# Patient Record
Sex: Male | Born: 1955 | Race: Black or African American | Hispanic: No | Marital: Married | Smoking: Current every day smoker
Health system: Southern US, Community
[De-identification: ages and names within clinical notes are randomized; demographics above are authoritative.]

## PROBLEM LIST (undated history)

## (undated) DIAGNOSIS — Z720 Tobacco use: Secondary | ICD-10-CM

## (undated) DIAGNOSIS — E785 Hyperlipidemia, unspecified: Secondary | ICD-10-CM

## (undated) DIAGNOSIS — M549 Dorsalgia, unspecified: Secondary | ICD-10-CM

## (undated) DIAGNOSIS — Q2381 Bicuspid aortic valve: Secondary | ICD-10-CM

## (undated) DIAGNOSIS — I10 Essential (primary) hypertension: Secondary | ICD-10-CM

## (undated) DIAGNOSIS — I351 Nonrheumatic aortic (valve) insufficiency: Secondary | ICD-10-CM

## (undated) DIAGNOSIS — Q231 Congenital insufficiency of aortic valve: Secondary | ICD-10-CM

## (undated) HISTORY — DX: Bicuspid aortic valve: Q23.81

## (undated) HISTORY — DX: Nonrheumatic aortic (valve) insufficiency: I35.1

## (undated) HISTORY — DX: Dorsalgia, unspecified: M54.9

## (undated) HISTORY — PX: EXTERNAL EAR SURGERY: SHX627

## (undated) HISTORY — DX: Hyperlipidemia, unspecified: E78.5

## (undated) HISTORY — DX: Congenital insufficiency of aortic valve: Q23.1

## (undated) HISTORY — DX: Tobacco use: Z72.0

## (undated) HISTORY — DX: Essential (primary) hypertension: I10

## (undated) HISTORY — PX: AORTIC VALVE REPLACEMENT: SHX41

---

## 2007-12-01 ENCOUNTER — Other Ambulatory Visit: Payer: Self-pay

## 2007-12-01 ENCOUNTER — Inpatient Hospital Stay: Payer: Self-pay | Admitting: Internal Medicine

## 2008-09-28 ENCOUNTER — Ambulatory Visit: Payer: Self-pay | Admitting: Oncology

## 2008-10-17 ENCOUNTER — Ambulatory Visit: Payer: Self-pay | Admitting: Oncology

## 2008-10-28 ENCOUNTER — Ambulatory Visit: Payer: Self-pay | Admitting: Oncology

## 2008-11-28 ENCOUNTER — Ambulatory Visit: Payer: Self-pay | Admitting: Oncology

## 2012-03-12 ENCOUNTER — Ambulatory Visit: Payer: Self-pay | Admitting: Family Medicine

## 2012-12-20 IMAGING — CR DG HIP COMPLETE 2+V*L*
1 series · 3 of 3 positions shown · non-contrast
Comparison: none

REASON FOR EXAM: pain in limb, left hip pain
COMMENTS:

PROCEDURE:     KDR - KDXR HIP LEFT COMPLETE  - March 12, 2012 [DATE]
RESULT:     Comparison:  None

[Series 1: ap · 0.17mm/px · 3 of 3 slices shown]
[im 1/3]
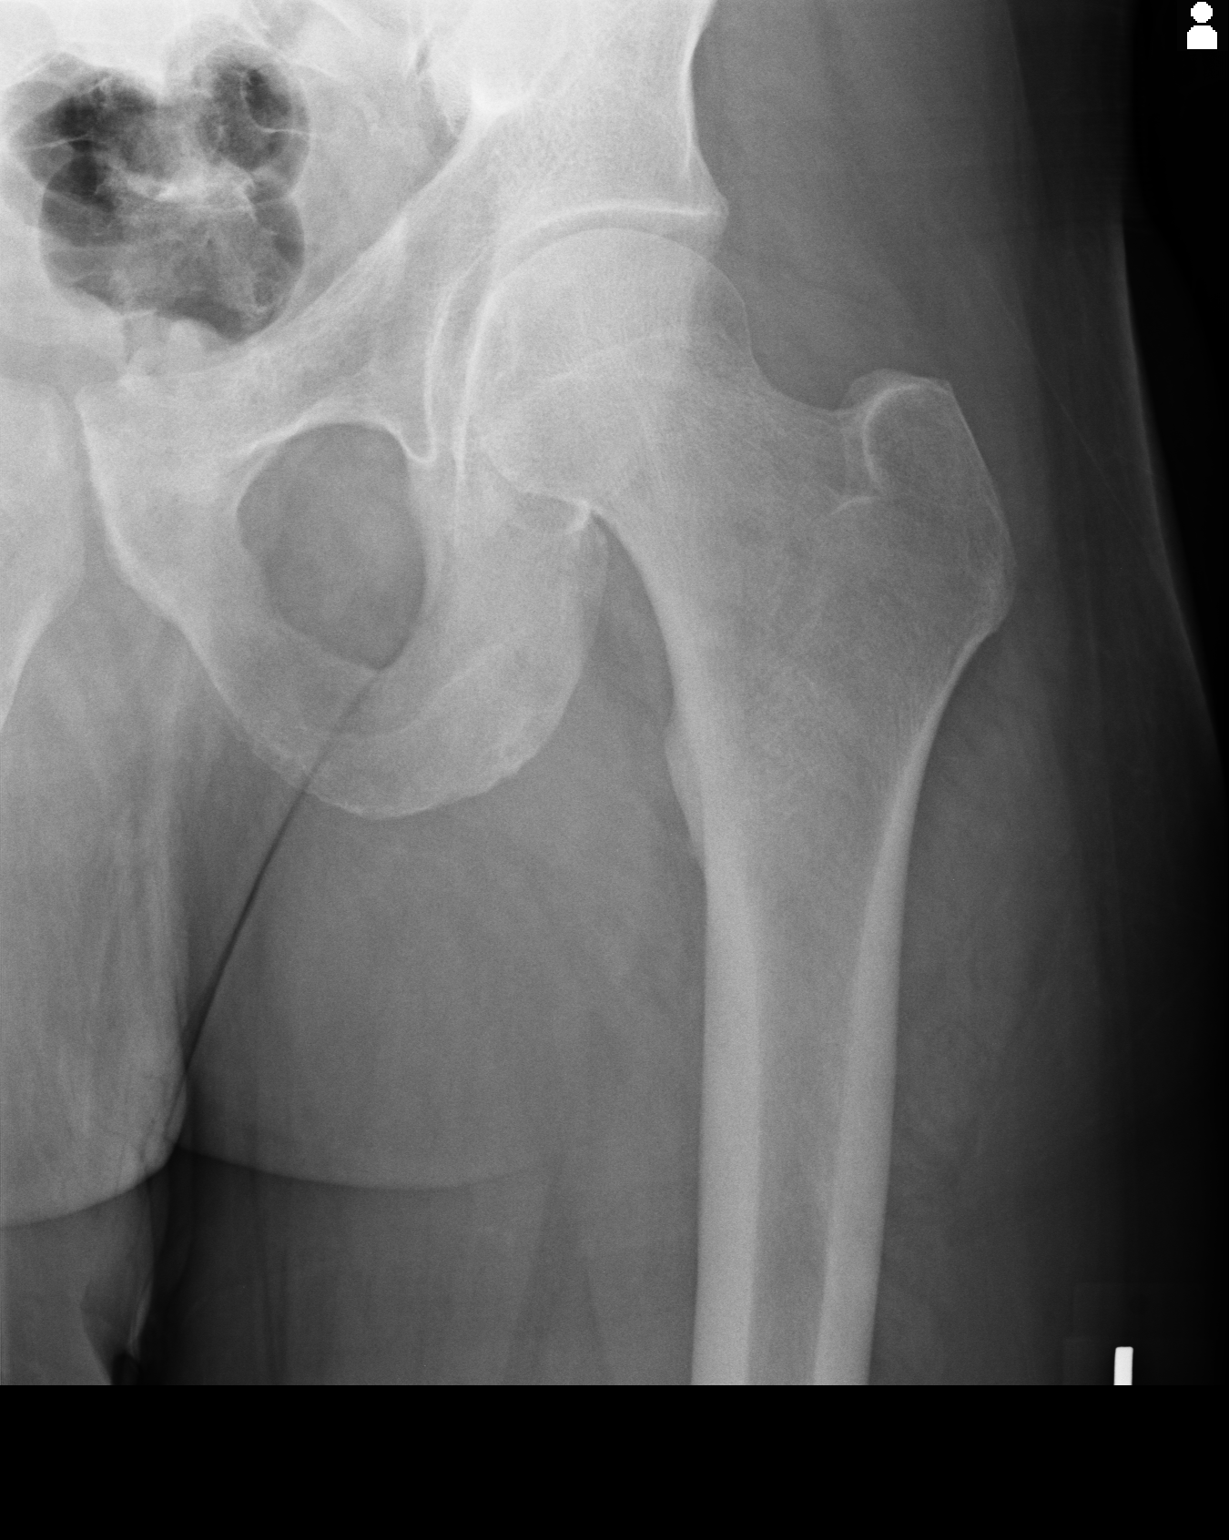
[im 2/3]
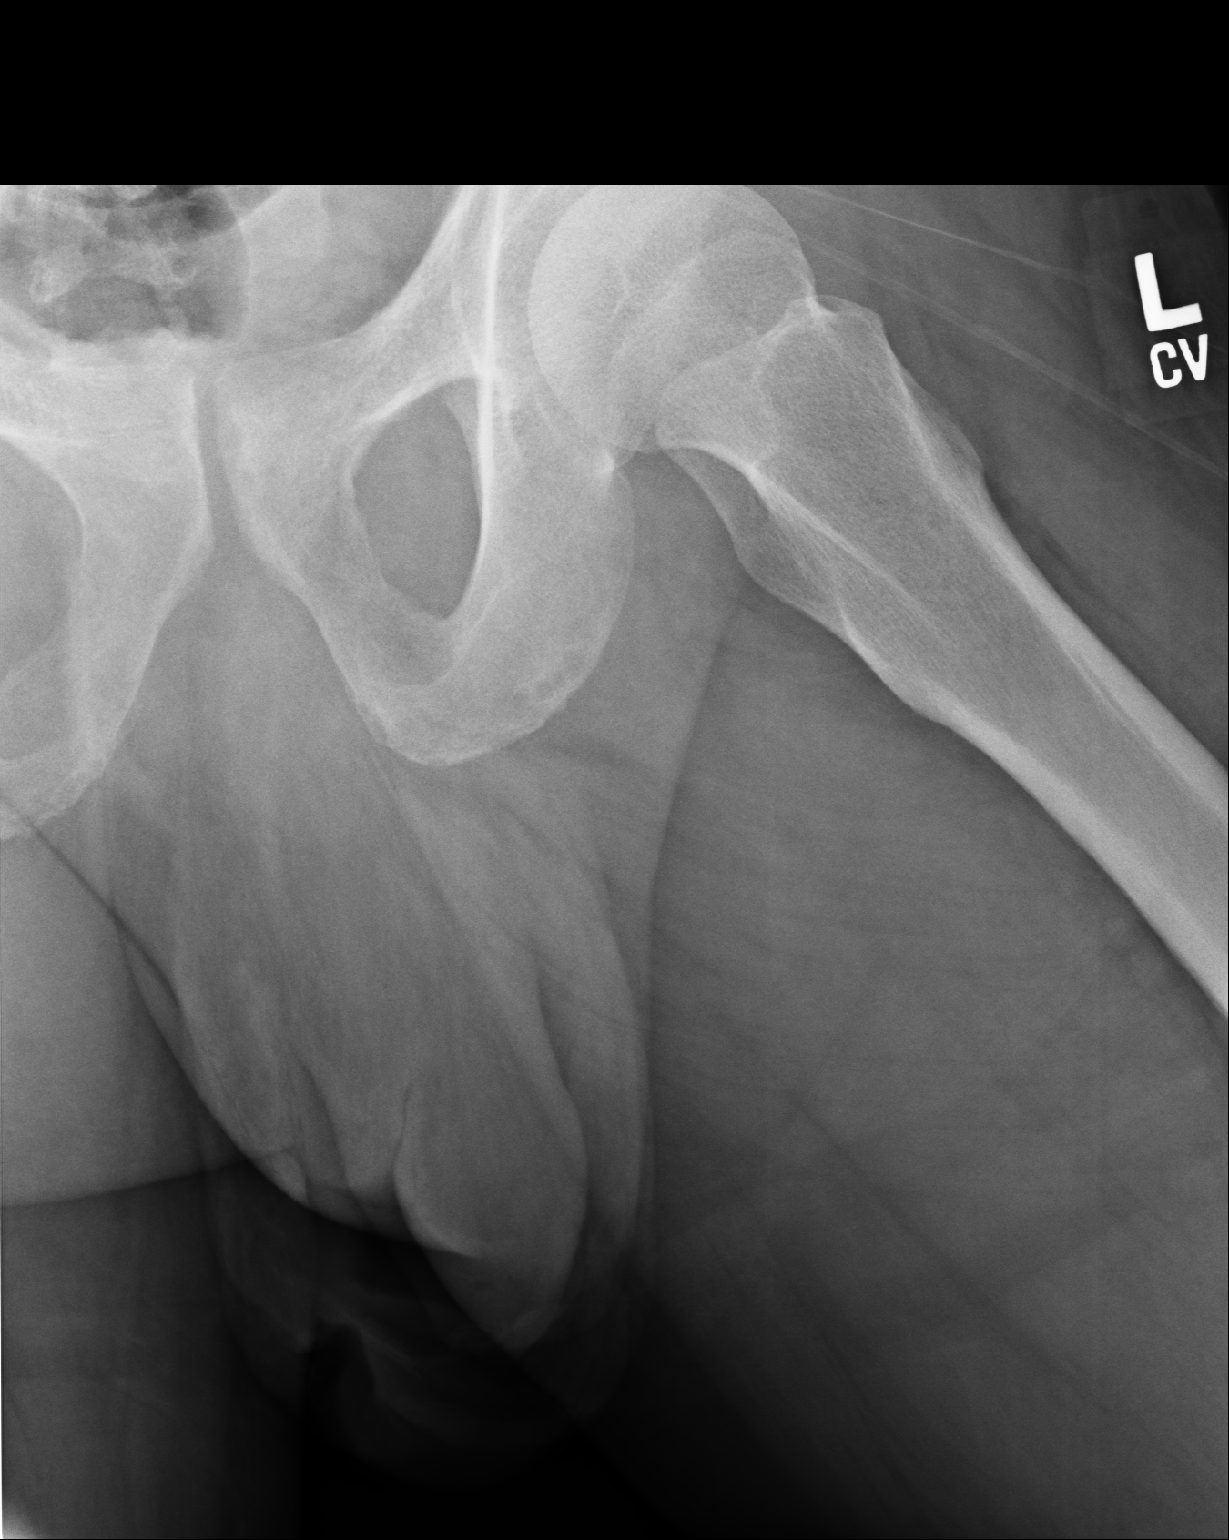
[im 3/3]
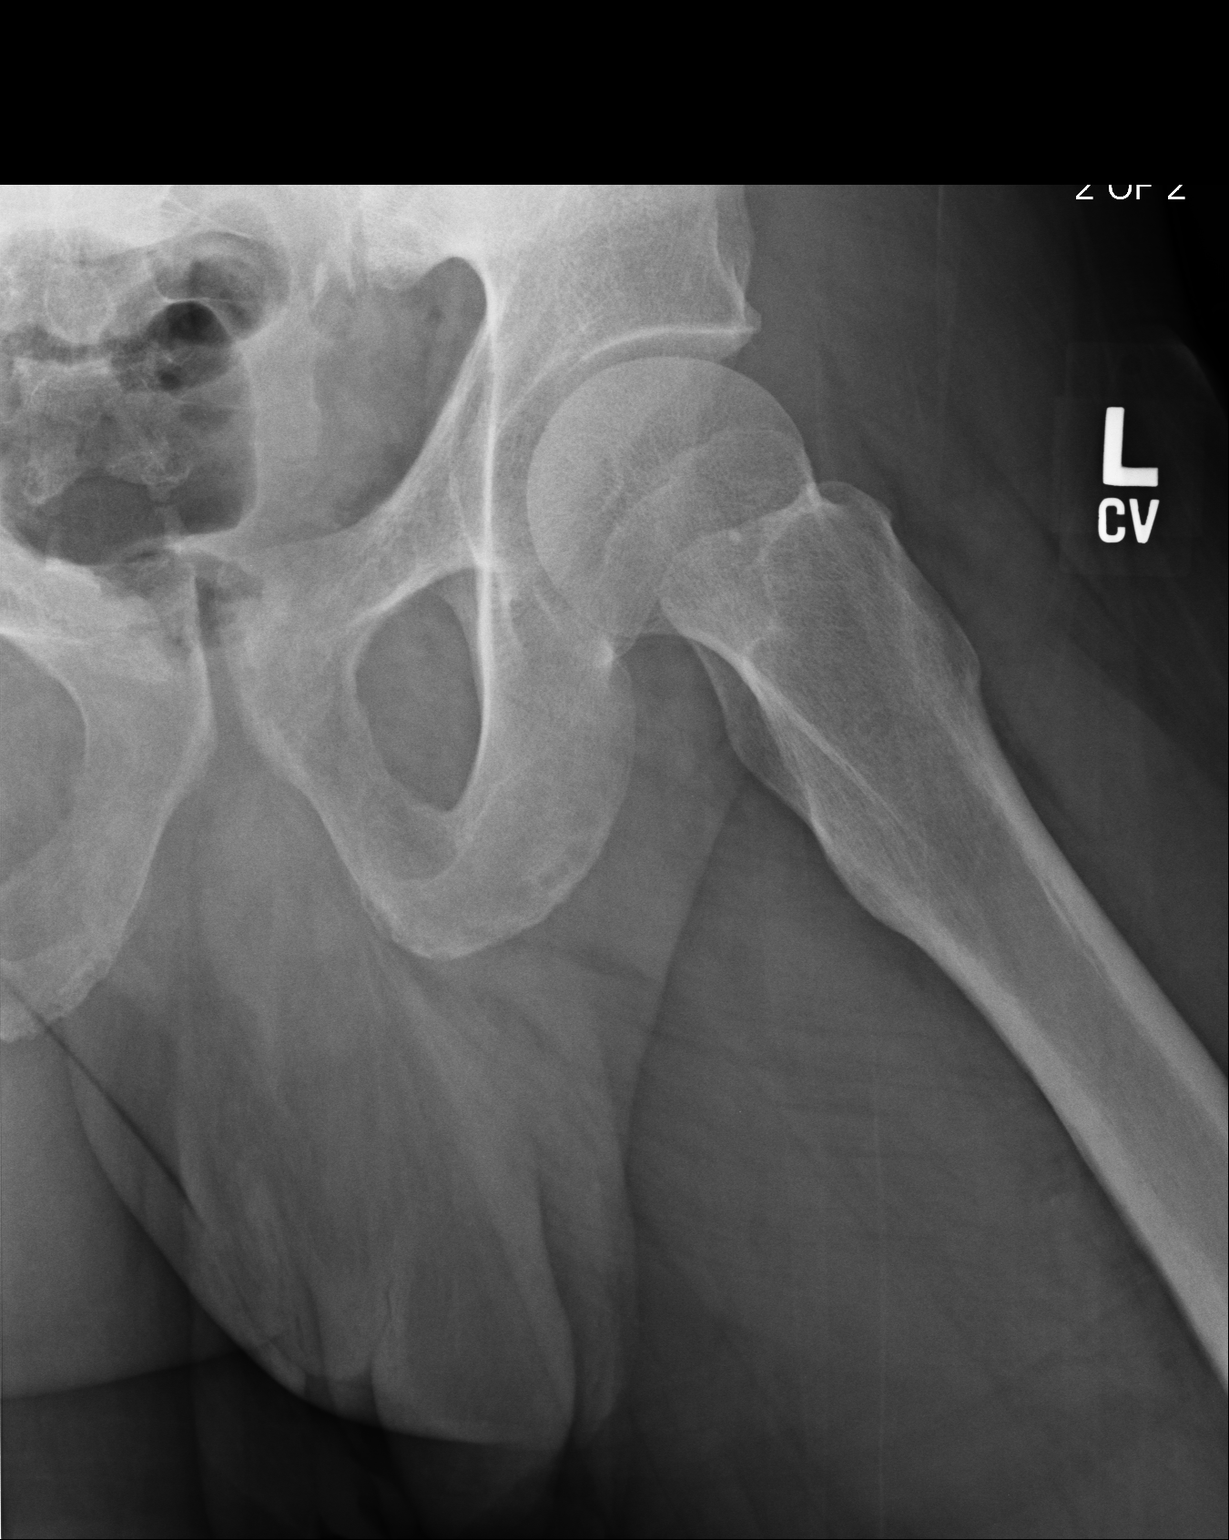

[3 of 3 positions shown; findings below may reference images not displayed]

FINDINGS: AP and frogleg lateral view of the left hip demonstrates no fracture or
dislocation. The joint space is maintained.
IMPRESSION: No acute osseous injury of the left hip.

## 2012-12-20 IMAGING — CR DG LUMBAR SPINE 2-3V
1 series · 3 of 3 positions shown · non-contrast
Comparison: none

REASON FOR EXAM: pain in limb, left hip pain
COMMENTS:

PROCEDURE:     KDR - KDXR LUMBAR SPINE AP AND LATERAL  - March 12, 2012 [DATE]
RESULT:     Comparison: None

[Series 1: ap · 0.17mm/px · 3 of 3 slices shown]
[im 1/3]
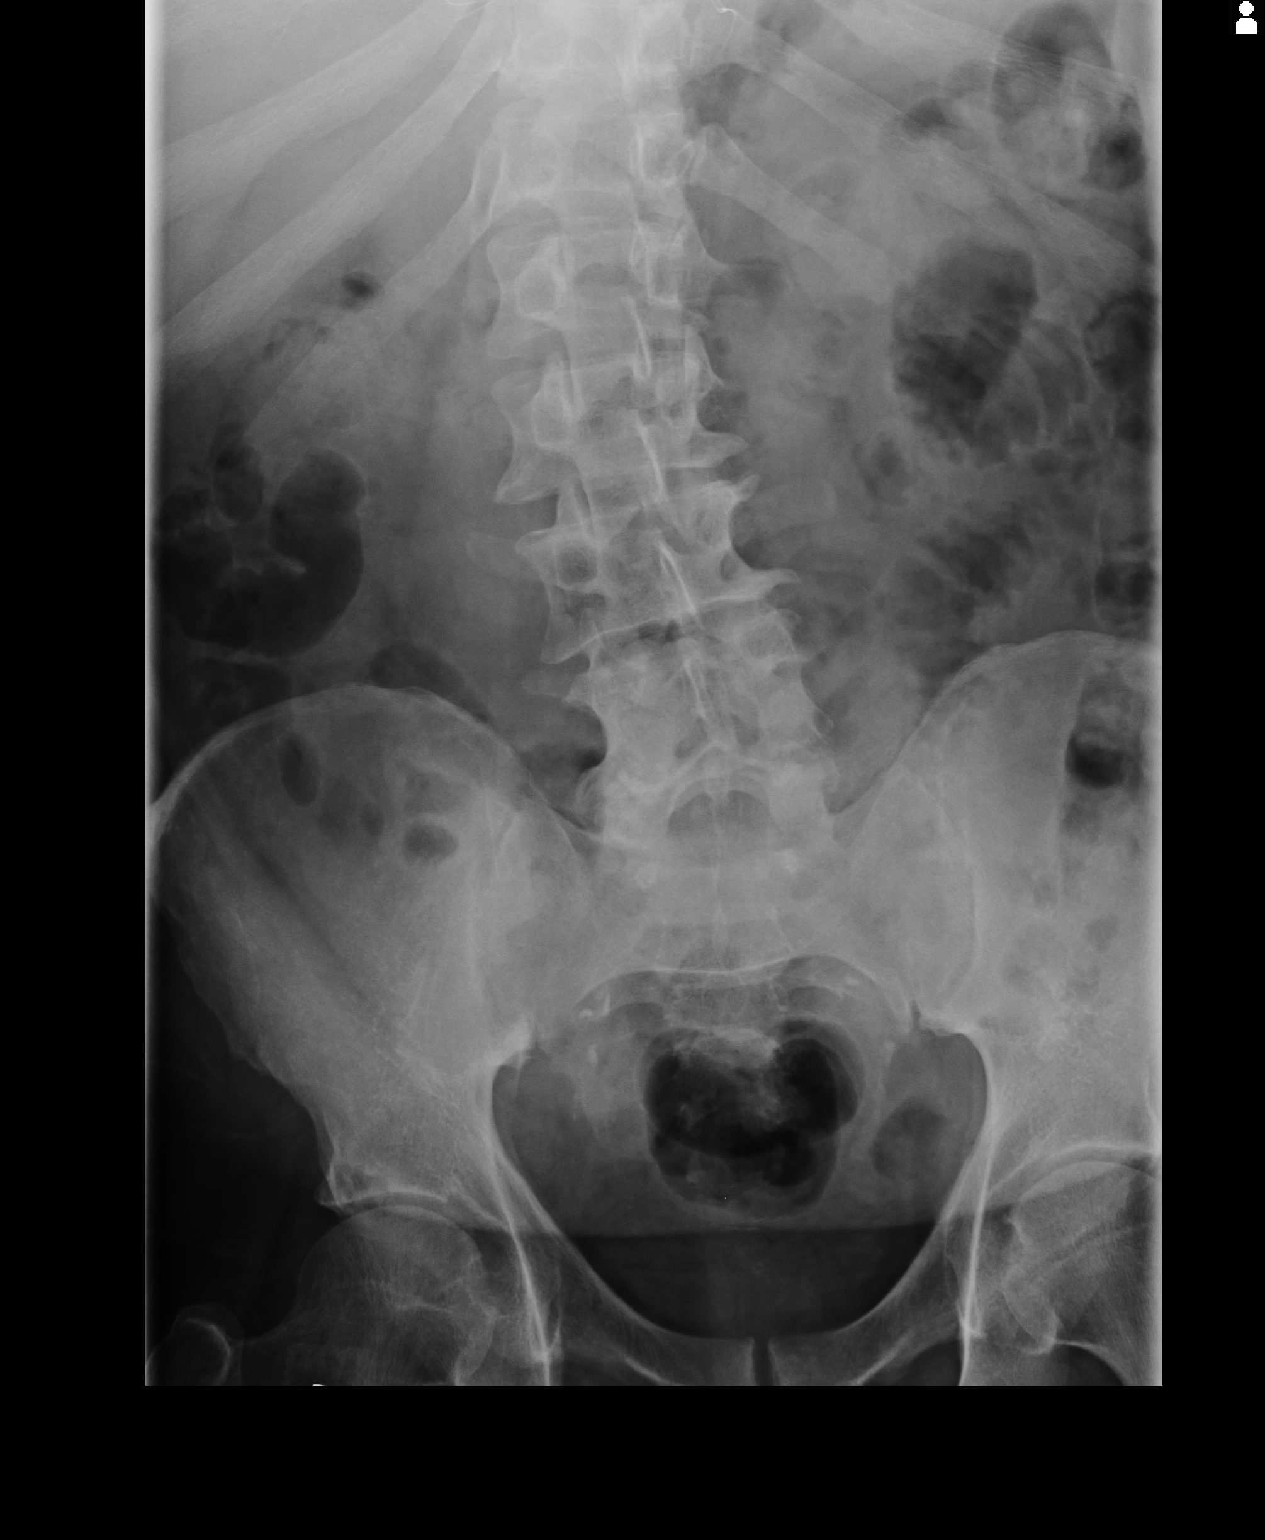
[im 2/3]
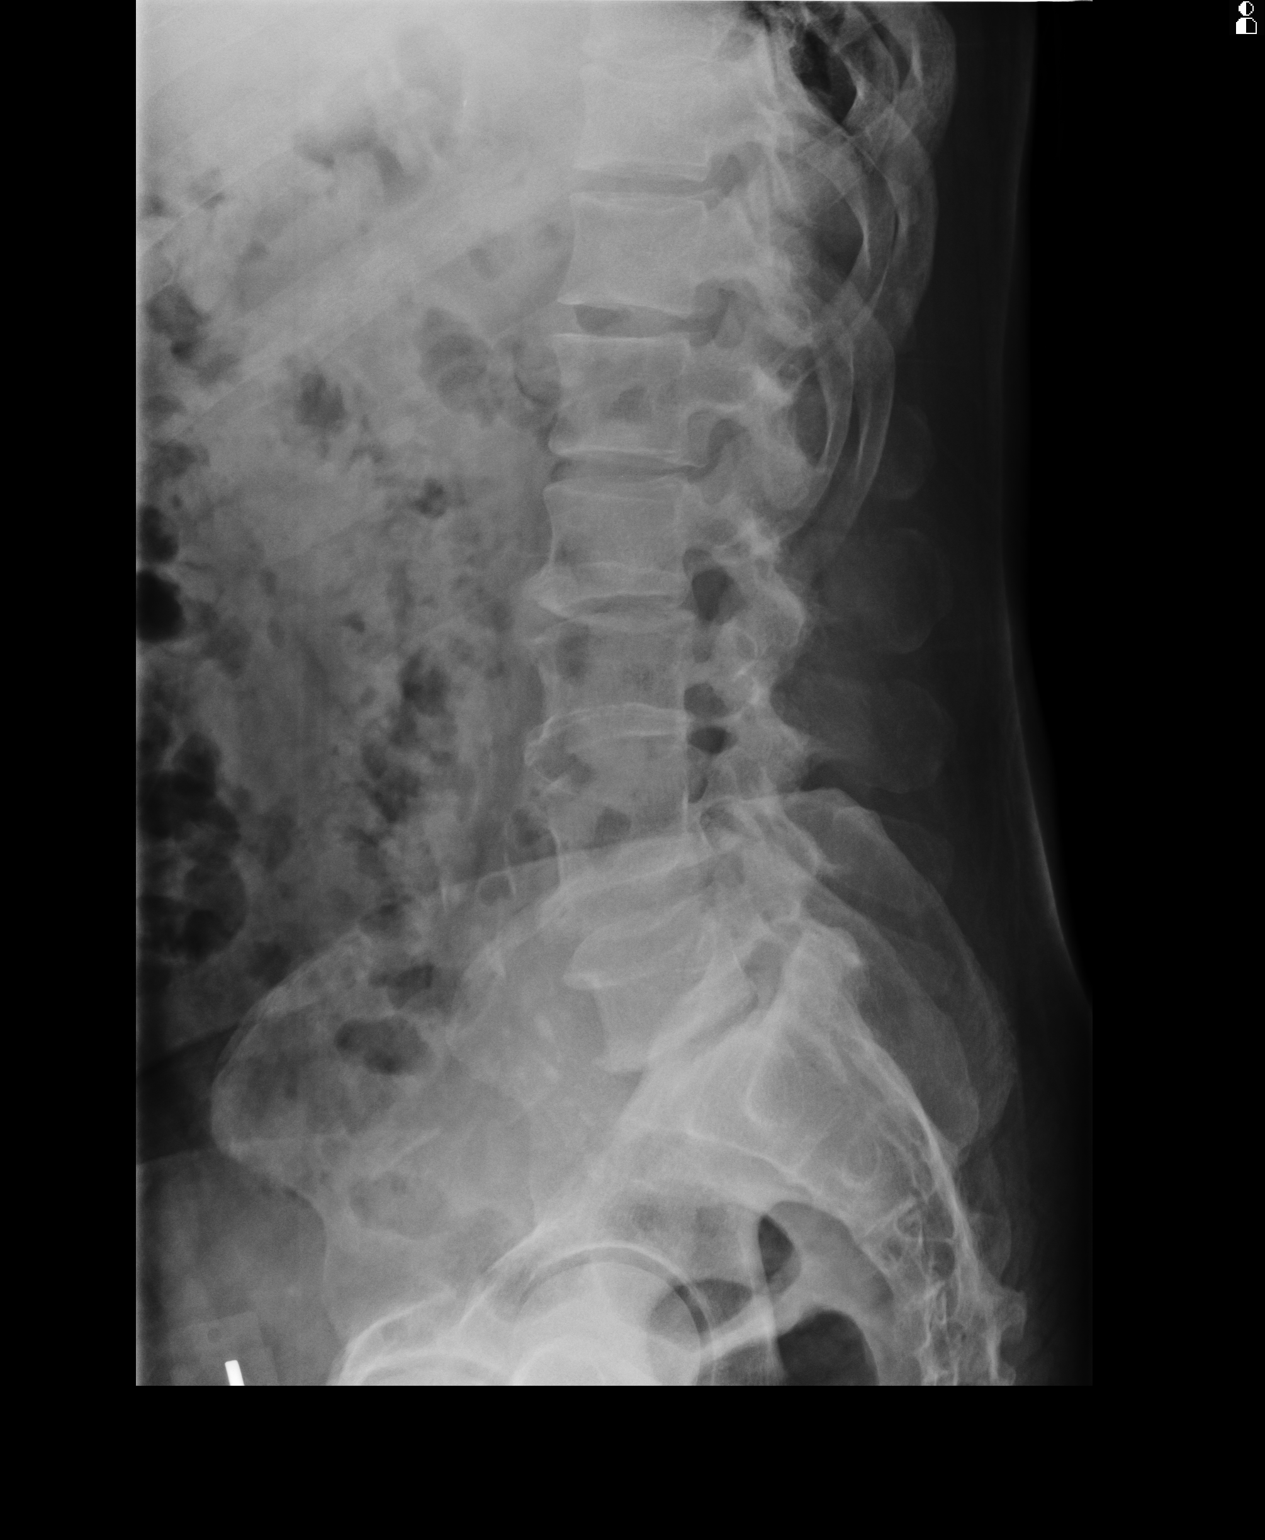
[im 3/3]
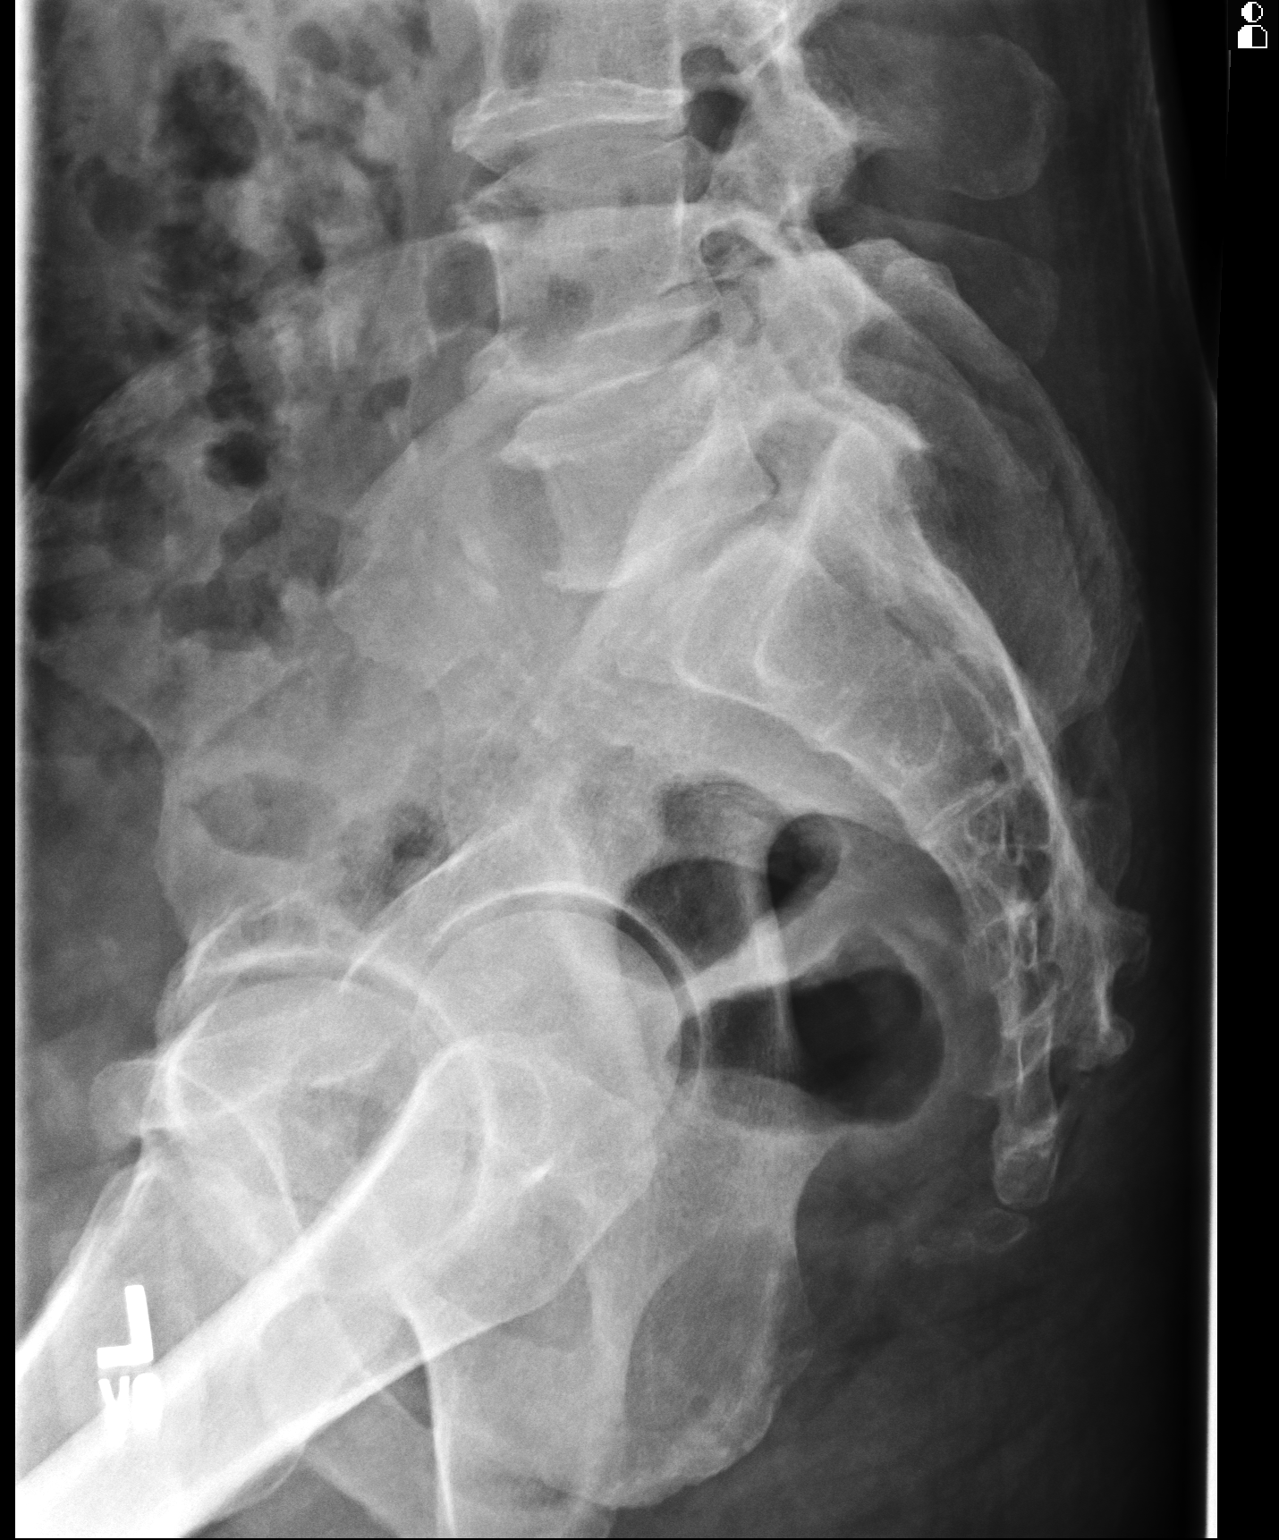

[3 of 3 positions shown; findings below may reference images not displayed]

FINDINGS: AP and lateral views of the lumbar spine and a coned down view of the
lumbosacral junction are provided.

There are 5 nonrib bearing lumbar-type vertebral bodies. There is a
levocurvature of the lumbar spine. The vertebral body heights are
maintained. The alignment is anatomic. There is no spondylolysis. There is
no acute fracture or static listhesis. There is mild degenerative disc
disease of the lumbar spine.

The SI joints are unremarkable.
IMPRESSION: 1. No acute osseous abnormality of the lumbar spine.

## 2014-05-09 ENCOUNTER — Ambulatory Visit: Payer: Self-pay | Admitting: Cardiovascular Disease

## 2014-06-30 ENCOUNTER — Ambulatory Visit: Payer: Self-pay | Admitting: Cardiovascular Disease

## 2014-07-10 ENCOUNTER — Encounter: Payer: Self-pay | Admitting: Cardiovascular Disease

## 2014-07-10 ENCOUNTER — Encounter (INDEPENDENT_AMBULATORY_CARE_PROVIDER_SITE_OTHER): Payer: Self-pay

## 2014-07-10 ENCOUNTER — Ambulatory Visit (INDEPENDENT_AMBULATORY_CARE_PROVIDER_SITE_OTHER): Payer: 59 | Admitting: Cardiovascular Disease

## 2014-07-10 VITALS — BP 140/78 | HR 59 | Ht 69.0 in | Wt 185.8 lb

## 2014-07-10 DIAGNOSIS — Z7901 Long term (current) use of anticoagulants: Secondary | ICD-10-CM

## 2014-07-10 DIAGNOSIS — IMO0001 Reserved for inherently not codable concepts without codable children: Secondary | ICD-10-CM

## 2014-07-10 DIAGNOSIS — I080 Rheumatic disorders of both mitral and aortic valves: Secondary | ICD-10-CM

## 2014-07-10 DIAGNOSIS — I359 Nonrheumatic aortic valve disorder, unspecified: Secondary | ICD-10-CM | POA: Insufficient documentation

## 2014-07-10 DIAGNOSIS — I10 Essential (primary) hypertension: Secondary | ICD-10-CM | POA: Insufficient documentation

## 2014-07-10 DIAGNOSIS — R030 Elevated blood-pressure reading, without diagnosis of hypertension: Secondary | ICD-10-CM

## 2014-07-10 DIAGNOSIS — I38 Endocarditis, valve unspecified: Secondary | ICD-10-CM

## 2014-07-10 NOTE — Progress Notes (Signed)
HPI  This is a pleasant 58 year old African American male who is here today to establish cardiovascular care. He is transferring from [employer]. I took care of him in 2009 when he presented with symptomatic critical aortic stenosis due to bicuspid aortic valve. He underwent aortic valve replacement with an investigational mechanical aortic valve at [facility] without complications. Cardiac catheterization before surgery showed no significant coronary artery disease with mildly dilated ascending aorta. Maintaining therapeutic INR was extremely difficult and required a very large dose of warfarin. He was evaluated by hematology and ultimately he achieved therapeutic INR on 17.5 mg of warfarin daily He has been doing very well and denies any chest pain or significant dyspnea. He is not a smoker. His work is very physical and requires significant lifting. He works at a [employer].   Allergies  Allergen Reactions  . Penicillins   . Tetracyclines & Related      No current outpatient prescriptions on file prior to visit.   No current facility-administered medications on file prior to visit.     Past Medical History  Diagnosis Date  . Leaky heart valve   . Bicuspid aortic valve     With severe stenosis diagnosed in 2009. Status post mechanical aortic valve replacement at [facility]  . Back pain      Past Surgical History  Procedure Laterality Date  . Aortic valve replacement    . External ear surgery       History reviewed. No pertinent family history.   History   Social History  . Marital Status: Married    Spouse Name: N/A    Number of Children: N/A  . Years of Education: N/A   Occupational History  . Not on file.   Social History Main Topics  . Smoking status: Former Smoker -- 0 years    Types: Cigarettes  . Smokeless tobacco: Not on file  . Alcohol Use: Yes     Comment: occasional  . Drug Use: No  . Sexual Activity: Not on file   Other Topics  Concern  . Not on file   Social History Narrative  . No narrative on file     ROS A 10 point review of system was performed. It is negative other than that mentioned in the history of present illness.   PHYSICAL EXAM   BP 140/78  Pulse 59  Ht 5\' 9"  (1.753 m)  Wt 185 lb 12 oz (84.256 kg)  BMI 27.42 kg/m2 Constitutional: He is oriented to person, place, and time. He appears well-developed and well-nourished. No distress.  HENT: No nasal discharge.  Head: Normocephalic and atraumatic.  Eyes: Pupils are equal and round.  No discharge. Neck: Normal range of motion. Neck supple. No JVD present. No thyromegaly present.  Cardiovascular: Normal rate, regular rhythm, normal mechanical heart sounds. Exam reveals no gallop and no friction rub. There is one out of 6 systolic ejection murmur at the aortic area  Pulmonary/Chest: Effort normal and breath sounds normal. No stridor. No respiratory distress. He has no wheezes. He has no rales. He exhibits no tenderness.  Abdominal: Soft. Bowel sounds are normal. He exhibits no distension. There is no tenderness. There is no rebound and no guarding.  Musculoskeletal: Normal range of motion. He exhibits no edema and no tenderness.  Neurological: He is alert and oriented to person, place, and time. Coordination normal.  Skin: Skin is warm and dry. No rash noted. He is not diaphoretic. No erythema. No pallor.  Psychiatric: He has a normal mood and affect. His behavior is normal. Judgment and thought content normal.       ZOX:WRUEAEKG:Sinus  Bradycardia  -With rate variation  cv = 10. WITHIN NORMAL LIMITS   ASSESSMENT AND PLAN

## 2014-07-10 NOTE — Assessment & Plan Note (Signed)
Continue to monitor and consider more intensive treatment with an ARB if Ascending aorta is dilated.

## 2014-07-10 NOTE — Patient Instructions (Signed)
Your physician has requested that you have an echocardiogram. Echocardiography is a painless test that uses sound waves to create images of your heart. It provides your doctor with information about the size and shape of your heart and how well your heart's chambers and valves are working. This procedure takes approximately one hour. There are no restrictions for this procedure.  You have been referred to our coumadin clinic. Please schedule a new patient appt.  Your physician wants you to follow-up in: 6 months. You will receive a reminder letter in the mail two months in advance. If you don't receive a letter, please call our office to schedule the follow-up appointment.

## 2014-07-10 NOTE — Assessment & Plan Note (Signed)
The patient is status post mechanical aortic valve replacement in 2009. He did have mildly dilated ascending aorta. I recommend an echocardiogram to evaluate structural integrity of the valve as well measuring ascending aorta.  Continue long-term anticoagulation with warfarin. He was referred to our anticoagulation clinic.

## 2014-07-30 ENCOUNTER — Ambulatory Visit (INDEPENDENT_AMBULATORY_CARE_PROVIDER_SITE_OTHER): Payer: 59

## 2014-07-30 DIAGNOSIS — Z7901 Long term (current) use of anticoagulants: Secondary | ICD-10-CM

## 2014-07-30 DIAGNOSIS — I359 Nonrheumatic aortic valve disorder, unspecified: Secondary | ICD-10-CM

## 2014-07-30 DIAGNOSIS — Z952 Presence of prosthetic heart valve: Secondary | ICD-10-CM | POA: Insufficient documentation

## 2014-07-30 DIAGNOSIS — Z954 Presence of other heart-valve replacement: Secondary | ICD-10-CM

## 2014-07-30 LAB — POCT INR: INR: 5

## 2014-08-07 ENCOUNTER — Other Ambulatory Visit (INDEPENDENT_AMBULATORY_CARE_PROVIDER_SITE_OTHER): Payer: 59

## 2014-08-07 ENCOUNTER — Other Ambulatory Visit: Payer: Self-pay

## 2014-08-07 DIAGNOSIS — I359 Nonrheumatic aortic valve disorder, unspecified: Secondary | ICD-10-CM

## 2014-08-13 ENCOUNTER — Ambulatory Visit (INDEPENDENT_AMBULATORY_CARE_PROVIDER_SITE_OTHER): Payer: 59

## 2014-08-13 DIAGNOSIS — Z7901 Long term (current) use of anticoagulants: Secondary | ICD-10-CM

## 2014-08-13 DIAGNOSIS — I359 Nonrheumatic aortic valve disorder, unspecified: Secondary | ICD-10-CM

## 2014-08-13 DIAGNOSIS — Z954 Presence of other heart-valve replacement: Secondary | ICD-10-CM

## 2014-08-13 LAB — POCT INR: INR: 2.3

## 2014-08-14 ENCOUNTER — Telehealth: Payer: Self-pay | Admitting: *Deleted

## 2014-08-14 NOTE — Telephone Encounter (Signed)
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

## 2014-08-14 NOTE — Telephone Encounter (Signed)
He should avoid lifting more than 50 lbs.

## 2014-08-15 NOTE — Telephone Encounter (Signed)
LVM 9/18 

## 2014-08-20 NOTE — Telephone Encounter (Signed)
Informed patient of Dr. Aridas response  Patient verbalized understanding  

## 2014-09-03 ENCOUNTER — Ambulatory Visit (INDEPENDENT_AMBULATORY_CARE_PROVIDER_SITE_OTHER): Payer: 59

## 2014-09-03 DIAGNOSIS — Z954 Presence of other heart-valve replacement: Secondary | ICD-10-CM

## 2014-09-03 DIAGNOSIS — I359 Nonrheumatic aortic valve disorder, unspecified: Secondary | ICD-10-CM

## 2014-09-03 DIAGNOSIS — Z952 Presence of prosthetic heart valve: Secondary | ICD-10-CM

## 2014-09-03 DIAGNOSIS — Z7901 Long term (current) use of anticoagulants: Secondary | ICD-10-CM

## 2014-09-03 LAB — POCT INR: INR: 3

## 2014-10-01 ENCOUNTER — Ambulatory Visit (INDEPENDENT_AMBULATORY_CARE_PROVIDER_SITE_OTHER): Payer: 59

## 2014-10-01 DIAGNOSIS — Z952 Presence of prosthetic heart valve: Secondary | ICD-10-CM

## 2014-10-01 DIAGNOSIS — I359 Nonrheumatic aortic valve disorder, unspecified: Secondary | ICD-10-CM

## 2014-10-01 DIAGNOSIS — Z7901 Long term (current) use of anticoagulants: Secondary | ICD-10-CM

## 2014-10-01 DIAGNOSIS — Z954 Presence of other heart-valve replacement: Secondary | ICD-10-CM

## 2014-10-01 LAB — POCT INR: INR: 3.6

## 2014-10-22 ENCOUNTER — Ambulatory Visit (INDEPENDENT_AMBULATORY_CARE_PROVIDER_SITE_OTHER): Payer: 59

## 2014-10-22 DIAGNOSIS — I359 Nonrheumatic aortic valve disorder, unspecified: Secondary | ICD-10-CM

## 2014-10-22 DIAGNOSIS — Z952 Presence of prosthetic heart valve: Secondary | ICD-10-CM

## 2014-10-22 DIAGNOSIS — Z7901 Long term (current) use of anticoagulants: Secondary | ICD-10-CM

## 2014-10-22 DIAGNOSIS — Z954 Presence of other heart-valve replacement: Secondary | ICD-10-CM

## 2014-10-22 LAB — POCT INR: INR: 3.9

## 2014-10-28 ENCOUNTER — Telehealth: Payer: Self-pay | Admitting: Cardiovascular Disease

## 2014-10-28 MED ORDER — WARFARIN SODIUM 7.5 MG PO TABS: ORAL_TABLET | ORAL | Status: DC

## 2014-10-28 MED ORDER — WARFARIN SODIUM 10 MG PO TABS: ORAL_TABLET | ORAL | Status: DC

## 2014-10-28 NOTE — Telephone Encounter (Signed)
Please review for refill, Thank you. 

## 2014-10-28 NOTE — Telephone Encounter (Signed)
Pt needs refill on warfain, [place] [facility] please

## 2014-11-05 ENCOUNTER — Telehealth: Payer: Self-pay | Admitting: *Deleted

## 2014-11-05 ENCOUNTER — Ambulatory Visit (INDEPENDENT_AMBULATORY_CARE_PROVIDER_SITE_OTHER): Payer: 59

## 2014-11-05 DIAGNOSIS — Z7901 Long term (current) use of anticoagulants: Secondary | ICD-10-CM

## 2014-11-05 DIAGNOSIS — Z952 Presence of prosthetic heart valve: Secondary | ICD-10-CM

## 2014-11-05 DIAGNOSIS — Z954 Presence of other heart-valve replacement: Secondary | ICD-10-CM

## 2014-11-05 DIAGNOSIS — I359 Nonrheumatic aortic valve disorder, unspecified: Secondary | ICD-10-CM

## 2014-11-05 LAB — POCT INR: INR: 2.7

## 2014-11-05 NOTE — Telephone Encounter (Signed)
Refill authorization request for Warfarin faxed to [facility]

## 2014-11-26 ENCOUNTER — Ambulatory Visit (INDEPENDENT_AMBULATORY_CARE_PROVIDER_SITE_OTHER): Payer: 59

## 2014-11-26 DIAGNOSIS — Z952 Presence of prosthetic heart valve: Secondary | ICD-10-CM

## 2014-11-26 DIAGNOSIS — Z954 Presence of other heart-valve replacement: Secondary | ICD-10-CM

## 2014-11-26 DIAGNOSIS — Z7901 Long term (current) use of anticoagulants: Secondary | ICD-10-CM

## 2014-11-26 DIAGNOSIS — I359 Nonrheumatic aortic valve disorder, unspecified: Secondary | ICD-10-CM

## 2014-11-26 LAB — POCT INR: INR: 2.1

## 2014-12-24 ENCOUNTER — Ambulatory Visit (INDEPENDENT_AMBULATORY_CARE_PROVIDER_SITE_OTHER): Payer: 59

## 2014-12-24 DIAGNOSIS — Z954 Presence of other heart-valve replacement: Secondary | ICD-10-CM

## 2014-12-24 DIAGNOSIS — Z7901 Long term (current) use of anticoagulants: Secondary | ICD-10-CM

## 2014-12-24 DIAGNOSIS — I359 Nonrheumatic aortic valve disorder, unspecified: Secondary | ICD-10-CM

## 2014-12-24 DIAGNOSIS — Z952 Presence of prosthetic heart valve: Secondary | ICD-10-CM

## 2014-12-24 LAB — POCT INR: INR: 2.9

## 2015-01-28 ENCOUNTER — Ambulatory Visit (INDEPENDENT_AMBULATORY_CARE_PROVIDER_SITE_OTHER): Payer: 59 | Admitting: *Deleted

## 2015-01-28 DIAGNOSIS — Z954 Presence of other heart-valve replacement: Secondary | ICD-10-CM

## 2015-01-28 DIAGNOSIS — Z7901 Long term (current) use of anticoagulants: Secondary | ICD-10-CM

## 2015-01-28 DIAGNOSIS — Z952 Presence of prosthetic heart valve: Secondary | ICD-10-CM

## 2015-01-28 DIAGNOSIS — I359 Nonrheumatic aortic valve disorder, unspecified: Secondary | ICD-10-CM

## 2015-01-28 LAB — POCT INR: INR: 3

## 2015-02-24 ENCOUNTER — Other Ambulatory Visit: Payer: Self-pay | Admitting: Cardiovascular Disease

## 2015-02-25 NOTE — Telephone Encounter (Signed)
Please review for refill, Thank you. 

## 2015-03-11 ENCOUNTER — Ambulatory Visit (INDEPENDENT_AMBULATORY_CARE_PROVIDER_SITE_OTHER): Payer: 59

## 2015-03-11 DIAGNOSIS — I359 Nonrheumatic aortic valve disorder, unspecified: Secondary | ICD-10-CM | POA: Diagnosis not present

## 2015-03-11 DIAGNOSIS — Z7901 Long term (current) use of anticoagulants: Secondary | ICD-10-CM

## 2015-03-11 DIAGNOSIS — Z954 Presence of other heart-valve replacement: Secondary | ICD-10-CM | POA: Diagnosis not present

## 2015-03-11 DIAGNOSIS — Z952 Presence of prosthetic heart valve: Secondary | ICD-10-CM

## 2015-03-11 LAB — POCT INR: INR: 1.9

## 2015-03-26 ENCOUNTER — Ambulatory Visit: Payer: 59 | Admitting: Cardiovascular Disease

## 2015-04-15 ENCOUNTER — Ambulatory Visit (INDEPENDENT_AMBULATORY_CARE_PROVIDER_SITE_OTHER): Payer: 59

## 2015-04-15 DIAGNOSIS — Z954 Presence of other heart-valve replacement: Secondary | ICD-10-CM

## 2015-04-15 DIAGNOSIS — Z952 Presence of prosthetic heart valve: Secondary | ICD-10-CM

## 2015-04-15 DIAGNOSIS — I359 Nonrheumatic aortic valve disorder, unspecified: Secondary | ICD-10-CM | POA: Diagnosis not present

## 2015-04-15 DIAGNOSIS — Z7901 Long term (current) use of anticoagulants: Secondary | ICD-10-CM

## 2015-04-15 LAB — POCT INR: INR: 1.9

## 2015-04-17 ENCOUNTER — Ambulatory Visit (INDEPENDENT_AMBULATORY_CARE_PROVIDER_SITE_OTHER): Payer: 59 | Admitting: Cardiovascular Disease

## 2015-04-17 ENCOUNTER — Encounter: Payer: Self-pay | Admitting: Cardiovascular Disease

## 2015-04-17 VITALS — BP 128/68 | HR 67 | Ht 69.0 in | Wt 184.8 lb

## 2015-04-17 DIAGNOSIS — Z720 Tobacco use: Secondary | ICD-10-CM | POA: Insufficient documentation

## 2015-04-17 DIAGNOSIS — R079 Chest pain, unspecified: Secondary | ICD-10-CM

## 2015-04-17 DIAGNOSIS — I359 Nonrheumatic aortic valve disorder, unspecified: Secondary | ICD-10-CM

## 2015-04-17 NOTE — Progress Notes (Signed)
HPI  This is a pleasant 59 year old [place] male who is here today for a follow-up visit. He presented in 2009 with symptomatic critical aortic stenosis due to bicuspid aortic valve. He underwent aortic valve replacement with an investigational mechanical aortic valve [facility] without complications. Cardiac catheterization before surgery showed no significant coronary artery disease with mildly dilated ascending aorta. Maintaining therapeutic INR was extremely difficult and required a very large dose of warfarin. He was evaluated by hematology and ultimately he achieved therapeutic INR on 17.5 mg of warfarin daily He has been doing very well and denies any chest pain or significant dyspnea. He got down in smoking cessation but he has been under significant stress at work. His work is very physical and requires significant lifting. He works at a [employer].   Allergies  Allergen Reactions  . Penicillins   . Tetracyclines & Related      Current Outpatient Prescriptions on File Prior to Visit  Medication Sig Dispense Refill  . metoprolol succinate (TOPROL-XL) 25 MG 24 hr tablet Take 25 mg by mouth daily.     Marland Kitchen. warfarin (COUMADIN) 10 MG tablet TAKE AS DIRECTED BY COUMADIN CLINIC 30 tablet 1  . warfarin (COUMADIN) 7.5 MG tablet TAKE AS DIRECTED BY COUMADIN CLINIC 30 tablet 1   No current facility-administered medications on file prior to visit.     Past Medical History  Diagnosis Date  . Leaky heart valve   . Bicuspid aortic valve     With severe stenosis diagnosed in 2009. Status post mechanical aortic valve replacement at [facility]  . Back pain      Past Surgical History  Procedure Laterality Date  . Aortic valve replacement    . External ear surgery       History reviewed. No pertinent family history.   History   Social History  . Marital Status: Married    Spouse Name: N/A  . Number of Children: N/A  . Years of Education: N/A   Occupational History  .  Not on file.   Social History Main Topics  . Smoking status: Current Every Day Smoker -- 0 years    Types: Cigarettes  . Smokeless tobacco: Not on file  . Alcohol Use: Yes     Comment: occasional  . Drug Use: No  . Sexual Activity: Not on file   Other Topics Concern  . Not on file   Social History Narrative     ROS A 10 point review of system was performed. It is negative other than that mentioned in the history of present illness.   PHYSICAL EXAM   BP 128/68 mmHg  Pulse 67  Ht 5\' 9"  (1.753 m)  Wt 184 lb 12 oz (83.802 kg)  BMI 27.27 kg/m2 Constitutional: He is oriented to person, place, and time. He appears well-developed and well-nourished. No distress.  HENT: No nasal discharge.  Head: Normocephalic and atraumatic.  Eyes: Pupils are equal and round.  No discharge. Neck: Normal range of motion. Neck supple. No JVD present. No thyromegaly present.  Cardiovascular: Normal rate, regular rhythm, normal mechanical heart sounds. Exam reveals no gallop and no friction rub. There is one out of 6 systolic ejection murmur at the aortic area  Pulmonary/Chest: Effort normal and breath sounds normal. No stridor. No respiratory distress. He has no wheezes. He has no rales. He exhibits no tenderness.  Abdominal: Soft. Bowel sounds are normal. He exhibits no distension. There is no tenderness. There is no rebound  and no guarding.  Musculoskeletal: Normal range of motion. He exhibits no edema and no tenderness.  Neurological: He is alert and oriented to person, place, and time. Coordination normal.  Skin: Skin is warm and dry. No rash noted. He is not diaphoretic. No erythema. No pallor.  Psychiatric: He has a normal mood and affect. His behavior is normal. Judgment and thought content normal.       EKG: Sinus  Rhythm  - occasional PAC    # PACs = 1. WITHIN NORMAL LIMITS    ASSESSMENT AND PLAN

## 2015-04-17 NOTE — Assessment & Plan Note (Signed)
I discussed with him the importance of smoking cessation. He smokes more at work due to stress. He is thinking about early retirement but he might have to continue for another 2 years to get full benefits.

## 2015-04-17 NOTE — Patient Instructions (Signed)
Medication Instructions: Continue same medications.   Labwork: None.   Procedures/Testing: None.   Follow-Up: 1 year with Dr. Camrynn Mcclintic  Any Additional Special Instructions Will Be Listed Below (If Applicable).   

## 2015-04-17 NOTE — Assessment & Plan Note (Addendum)
He is doing reasonably well with no significant cardiac symptoms. Echocardiogram last year showed normal LV systolic function with mildly increased gradient across the mechanical aortic valve with mild-to-moderate aortic regurgitation. Continue to monitor. I will consider repeat echocardiogram in 2 years. Continue long-term anticoagulation with warfarin with a target INR between 2 and 3. This is being managed by our anticoagulation clinic.

## 2015-05-20 ENCOUNTER — Ambulatory Visit (INDEPENDENT_AMBULATORY_CARE_PROVIDER_SITE_OTHER): Payer: 59

## 2015-05-20 DIAGNOSIS — I359 Nonrheumatic aortic valve disorder, unspecified: Secondary | ICD-10-CM

## 2015-05-20 DIAGNOSIS — Z7901 Long term (current) use of anticoagulants: Secondary | ICD-10-CM | POA: Diagnosis not present

## 2015-05-20 DIAGNOSIS — Z954 Presence of other heart-valve replacement: Secondary | ICD-10-CM | POA: Diagnosis not present

## 2015-05-20 DIAGNOSIS — Z952 Presence of prosthetic heart valve: Secondary | ICD-10-CM

## 2015-05-20 LAB — POCT INR: INR: 2.3

## 2015-06-10 ENCOUNTER — Other Ambulatory Visit: Payer: Self-pay | Admitting: *Deleted

## 2015-06-10 MED ORDER — WARFARIN SODIUM 7.5 MG PO TABS: ORAL_TABLET | ORAL | Status: DC

## 2015-06-10 MED ORDER — WARFARIN SODIUM 10 MG PO TABS: ORAL_TABLET | ORAL | Status: DC

## 2015-06-10 NOTE — Telephone Encounter (Signed)
Refills of coumadin done as requested

## 2015-06-12 ENCOUNTER — Other Ambulatory Visit: Payer: Self-pay

## 2015-06-12 MED ORDER — METOPROLOL SUCCINATE ER 25 MG PO TB24: 25.0000 mg | ORAL_TABLET | Freq: Every day | ORAL | Status: DC

## 2015-07-01 ENCOUNTER — Ambulatory Visit (INDEPENDENT_AMBULATORY_CARE_PROVIDER_SITE_OTHER): Payer: 59 | Admitting: *Deleted

## 2015-07-01 DIAGNOSIS — Z7901 Long term (current) use of anticoagulants: Secondary | ICD-10-CM | POA: Diagnosis not present

## 2015-07-01 DIAGNOSIS — I359 Nonrheumatic aortic valve disorder, unspecified: Secondary | ICD-10-CM

## 2015-07-01 DIAGNOSIS — Z954 Presence of other heart-valve replacement: Secondary | ICD-10-CM

## 2015-07-01 DIAGNOSIS — Z952 Presence of prosthetic heart valve: Secondary | ICD-10-CM

## 2015-07-01 LAB — POCT INR: INR: 2.1

## 2015-08-12 ENCOUNTER — Ambulatory Visit (INDEPENDENT_AMBULATORY_CARE_PROVIDER_SITE_OTHER): Payer: 59

## 2015-08-12 DIAGNOSIS — Z954 Presence of other heart-valve replacement: Secondary | ICD-10-CM | POA: Diagnosis not present

## 2015-08-12 DIAGNOSIS — I359 Nonrheumatic aortic valve disorder, unspecified: Secondary | ICD-10-CM

## 2015-08-12 DIAGNOSIS — Z7901 Long term (current) use of anticoagulants: Secondary | ICD-10-CM | POA: Diagnosis not present

## 2015-08-12 DIAGNOSIS — Z952 Presence of prosthetic heart valve: Secondary | ICD-10-CM

## 2015-08-12 LAB — POCT INR: INR: 1.6

## 2015-09-12 ENCOUNTER — Other Ambulatory Visit: Payer: Self-pay | Admitting: Cardiovascular Disease

## 2015-09-16 ENCOUNTER — Ambulatory Visit (INDEPENDENT_AMBULATORY_CARE_PROVIDER_SITE_OTHER): Payer: 59

## 2015-09-16 DIAGNOSIS — I359 Nonrheumatic aortic valve disorder, unspecified: Secondary | ICD-10-CM | POA: Diagnosis not present

## 2015-09-16 DIAGNOSIS — Z7901 Long term (current) use of anticoagulants: Secondary | ICD-10-CM

## 2015-09-16 DIAGNOSIS — Z954 Presence of other heart-valve replacement: Secondary | ICD-10-CM | POA: Diagnosis not present

## 2015-09-16 DIAGNOSIS — Z952 Presence of prosthetic heart valve: Secondary | ICD-10-CM

## 2015-09-16 LAB — POCT INR: INR: 2.4

## 2015-10-14 ENCOUNTER — Ambulatory Visit (INDEPENDENT_AMBULATORY_CARE_PROVIDER_SITE_OTHER): Payer: 59

## 2015-10-14 DIAGNOSIS — Z952 Presence of prosthetic heart valve: Secondary | ICD-10-CM

## 2015-10-14 DIAGNOSIS — I359 Nonrheumatic aortic valve disorder, unspecified: Secondary | ICD-10-CM | POA: Diagnosis not present

## 2015-10-14 DIAGNOSIS — Z954 Presence of other heart-valve replacement: Secondary | ICD-10-CM

## 2015-10-14 DIAGNOSIS — Z7901 Long term (current) use of anticoagulants: Secondary | ICD-10-CM | POA: Diagnosis not present

## 2015-10-14 LAB — POCT INR: INR: 2.9

## 2015-11-11 ENCOUNTER — Ambulatory Visit (INDEPENDENT_AMBULATORY_CARE_PROVIDER_SITE_OTHER): Payer: 59

## 2015-11-11 DIAGNOSIS — Z7901 Long term (current) use of anticoagulants: Secondary | ICD-10-CM | POA: Diagnosis not present

## 2015-11-11 DIAGNOSIS — I359 Nonrheumatic aortic valve disorder, unspecified: Secondary | ICD-10-CM

## 2015-11-11 DIAGNOSIS — Z954 Presence of other heart-valve replacement: Secondary | ICD-10-CM

## 2015-11-11 DIAGNOSIS — Z952 Presence of prosthetic heart valve: Secondary | ICD-10-CM

## 2015-11-11 LAB — POCT INR: INR: 1.8

## 2015-12-09 ENCOUNTER — Ambulatory Visit (INDEPENDENT_AMBULATORY_CARE_PROVIDER_SITE_OTHER): Payer: 59

## 2015-12-09 DIAGNOSIS — Z954 Presence of other heart-valve replacement: Secondary | ICD-10-CM

## 2015-12-09 DIAGNOSIS — I359 Nonrheumatic aortic valve disorder, unspecified: Secondary | ICD-10-CM

## 2015-12-09 DIAGNOSIS — Z7901 Long term (current) use of anticoagulants: Secondary | ICD-10-CM | POA: Diagnosis not present

## 2015-12-09 DIAGNOSIS — Z952 Presence of prosthetic heart valve: Secondary | ICD-10-CM

## 2015-12-09 LAB — POCT INR: INR: 2.3

## 2016-01-29 ENCOUNTER — Other Ambulatory Visit: Payer: Self-pay | Admitting: Cardiovascular Disease

## 2016-01-29 NOTE — Telephone Encounter (Signed)
Please review for refill. Thanks!  

## 2016-01-30 ENCOUNTER — Other Ambulatory Visit: Payer: Self-pay | Admitting: Cardiovascular Disease

## 2016-02-01 NOTE — Telephone Encounter (Signed)
Please review for refill. Thanks!  

## 2016-02-01 NOTE — Telephone Encounter (Signed)
Pt is overdue for f/u, refilled x 1 month only, [facility] this Wednesday 02/03/16.

## 2016-02-03 ENCOUNTER — Ambulatory Visit (INDEPENDENT_AMBULATORY_CARE_PROVIDER_SITE_OTHER): Payer: 59

## 2016-02-03 DIAGNOSIS — Z954 Presence of other heart-valve replacement: Secondary | ICD-10-CM

## 2016-02-03 DIAGNOSIS — Z7901 Long term (current) use of anticoagulants: Secondary | ICD-10-CM

## 2016-02-03 DIAGNOSIS — I359 Nonrheumatic aortic valve disorder, unspecified: Secondary | ICD-10-CM | POA: Diagnosis not present

## 2016-02-03 DIAGNOSIS — Z952 Presence of prosthetic heart valve: Secondary | ICD-10-CM

## 2016-02-03 LAB — POCT INR: INR: 2.5

## 2016-02-03 MED ORDER — WARFARIN SODIUM 10 MG PO TABS: ORAL_TABLET | ORAL | Status: DC

## 2016-02-03 MED ORDER — WARFARIN SODIUM 7.5 MG PO TABS: ORAL_TABLET | ORAL | Status: DC

## 2016-03-09 ENCOUNTER — Ambulatory Visit (INDEPENDENT_AMBULATORY_CARE_PROVIDER_SITE_OTHER): Payer: 59

## 2016-03-09 DIAGNOSIS — Z954 Presence of other heart-valve replacement: Secondary | ICD-10-CM

## 2016-03-09 DIAGNOSIS — Z7901 Long term (current) use of anticoagulants: Secondary | ICD-10-CM | POA: Diagnosis not present

## 2016-03-09 DIAGNOSIS — I359 Nonrheumatic aortic valve disorder, unspecified: Secondary | ICD-10-CM

## 2016-03-09 DIAGNOSIS — Z952 Presence of prosthetic heart valve: Secondary | ICD-10-CM

## 2016-03-09 LAB — POCT INR: INR: 2.4

## 2016-04-27 ENCOUNTER — Ambulatory Visit (INDEPENDENT_AMBULATORY_CARE_PROVIDER_SITE_OTHER): Payer: 59

## 2016-04-27 DIAGNOSIS — Z7901 Long term (current) use of anticoagulants: Secondary | ICD-10-CM | POA: Diagnosis not present

## 2016-04-27 DIAGNOSIS — I359 Nonrheumatic aortic valve disorder, unspecified: Secondary | ICD-10-CM

## 2016-04-27 DIAGNOSIS — Z952 Presence of prosthetic heart valve: Secondary | ICD-10-CM

## 2016-04-27 DIAGNOSIS — Z954 Presence of other heart-valve replacement: Secondary | ICD-10-CM | POA: Diagnosis not present

## 2016-04-27 LAB — POCT INR: INR: 1.3

## 2016-05-11 ENCOUNTER — Ambulatory Visit (INDEPENDENT_AMBULATORY_CARE_PROVIDER_SITE_OTHER): Payer: 59

## 2016-05-11 DIAGNOSIS — Z952 Presence of prosthetic heart valve: Secondary | ICD-10-CM

## 2016-05-11 DIAGNOSIS — I359 Nonrheumatic aortic valve disorder, unspecified: Secondary | ICD-10-CM

## 2016-05-11 DIAGNOSIS — Z954 Presence of other heart-valve replacement: Secondary | ICD-10-CM | POA: Diagnosis not present

## 2016-05-11 DIAGNOSIS — Z7901 Long term (current) use of anticoagulants: Secondary | ICD-10-CM

## 2016-05-11 LAB — POCT INR: INR: 2.8

## 2016-05-26 ENCOUNTER — Other Ambulatory Visit: Payer: Self-pay | Admitting: Cardiovascular Disease

## 2016-05-26 NOTE — Telephone Encounter (Signed)
Review for refill, Thank you. 

## 2016-06-22 ENCOUNTER — Ambulatory Visit (INDEPENDENT_AMBULATORY_CARE_PROVIDER_SITE_OTHER): Payer: 59

## 2016-06-22 DIAGNOSIS — Z954 Presence of other heart-valve replacement: Secondary | ICD-10-CM

## 2016-06-22 DIAGNOSIS — Z7901 Long term (current) use of anticoagulants: Secondary | ICD-10-CM

## 2016-06-22 DIAGNOSIS — Z952 Presence of prosthetic heart valve: Secondary | ICD-10-CM

## 2016-06-22 DIAGNOSIS — I359 Nonrheumatic aortic valve disorder, unspecified: Secondary | ICD-10-CM

## 2016-06-22 LAB — POCT INR: INR: 1.7

## 2016-07-13 ENCOUNTER — Other Ambulatory Visit: Payer: Self-pay | Admitting: Cardiovascular Disease

## 2016-07-13 ENCOUNTER — Ambulatory Visit (INDEPENDENT_AMBULATORY_CARE_PROVIDER_SITE_OTHER): Payer: 59

## 2016-07-13 DIAGNOSIS — Z952 Presence of prosthetic heart valve: Secondary | ICD-10-CM

## 2016-07-13 DIAGNOSIS — Z7901 Long term (current) use of anticoagulants: Secondary | ICD-10-CM | POA: Diagnosis not present

## 2016-07-13 DIAGNOSIS — I359 Nonrheumatic aortic valve disorder, unspecified: Secondary | ICD-10-CM | POA: Diagnosis not present

## 2016-07-13 DIAGNOSIS — Z954 Presence of other heart-valve replacement: Secondary | ICD-10-CM | POA: Diagnosis not present

## 2016-07-13 LAB — POCT INR: INR: 1.9

## 2016-07-13 NOTE — Telephone Encounter (Signed)
Please review for refill. Thanks!  

## 2016-08-10 ENCOUNTER — Ambulatory Visit (INDEPENDENT_AMBULATORY_CARE_PROVIDER_SITE_OTHER): Payer: 59 | Admitting: *Deleted

## 2016-08-10 DIAGNOSIS — Z7901 Long term (current) use of anticoagulants: Secondary | ICD-10-CM

## 2016-08-10 DIAGNOSIS — Z954 Presence of other heart-valve replacement: Secondary | ICD-10-CM

## 2016-08-10 DIAGNOSIS — I359 Nonrheumatic aortic valve disorder, unspecified: Secondary | ICD-10-CM | POA: Diagnosis not present

## 2016-08-10 DIAGNOSIS — Z952 Presence of prosthetic heart valve: Secondary | ICD-10-CM

## 2016-08-10 LAB — POCT INR: INR: 3.6

## 2016-08-31 ENCOUNTER — Ambulatory Visit (INDEPENDENT_AMBULATORY_CARE_PROVIDER_SITE_OTHER): Payer: 59

## 2016-08-31 DIAGNOSIS — Z952 Presence of prosthetic heart valve: Secondary | ICD-10-CM

## 2016-08-31 DIAGNOSIS — Z7901 Long term (current) use of anticoagulants: Secondary | ICD-10-CM | POA: Diagnosis not present

## 2016-08-31 DIAGNOSIS — I359 Nonrheumatic aortic valve disorder, unspecified: Secondary | ICD-10-CM | POA: Diagnosis not present

## 2016-08-31 LAB — POCT INR: INR: 2.1

## 2016-09-09 ENCOUNTER — Ambulatory Visit (INDEPENDENT_AMBULATORY_CARE_PROVIDER_SITE_OTHER): Payer: 59 | Admitting: Cardiovascular Disease

## 2016-09-09 ENCOUNTER — Encounter: Payer: Self-pay | Admitting: Cardiovascular Disease

## 2016-09-09 VITALS — BP 120/70 | HR 62 | Ht 69.0 in | Wt 184.8 lb

## 2016-09-09 DIAGNOSIS — Z7901 Long term (current) use of anticoagulants: Secondary | ICD-10-CM | POA: Diagnosis not present

## 2016-09-09 DIAGNOSIS — I359 Nonrheumatic aortic valve disorder, unspecified: Secondary | ICD-10-CM | POA: Diagnosis not present

## 2016-09-09 DIAGNOSIS — Z720 Tobacco use: Secondary | ICD-10-CM

## 2016-09-09 NOTE — Patient Instructions (Addendum)
Medication Instructions:  Your physician recommends that you continue on your current medications as directed. Please refer to the Current Medication list given to you today.   Labwork: none  Testing/Procedures: Your physician has requested that you have an echocardiogram. Echocardiography is a painless test that uses sound waves to create images of your heart. It provides your doctor with information about the size and shape of your heart and how well your heart's chambers and valves are working. This procedure takes approximately one hour. There are no restrictions for this procedure.    Follow-Up: Your physician wants you to follow-up in: one year with Dr. Arida.  You will receive a reminder letter in the mail two months in advance. If you don't receive a letter, please call our office to schedule the follow-up appointment.   Any Other Special Instructions Will Be Listed Below (If Applicable).     If you need a refill on your cardiac medications before your next appointment, please call your pharmacy.  Echocardiogram An echocardiogram, or echocardiography, uses sound waves (ultrasound) to produce an image of your heart. The echocardiogram is simple, painless, obtained within a short period of time, and offers valuable information to your health care provider. The images from an echocardiogram can provide information such as:  Evidence of coronary artery disease (CAD).  Heart size.  Heart muscle function.  Heart valve function.  Aneurysm detection.  Evidence of a past heart attack.  Fluid buildup around the heart.  Heart muscle thickening.  Assess heart valve function. LET YOUR HEALTH CARE PROVIDER KNOW ABOUT:  Any allergies you have.  All medicines you are taking, including vitamins, herbs, eye drops, creams, and over-the-counter medicines.  Previous problems you or members of your family have had with the use of anesthetics.  Any blood disorders you  have.  Previous surgeries you have had.  Medical conditions you have.  Possibility of pregnancy, if this applies. BEFORE THE PROCEDURE  No special preparation is needed. Eat and drink normally.  PROCEDURE   In order to produce an image of your heart, gel will be applied to your chest and a wand-like tool (transducer) will be moved over your chest. The gel will help transmit the sound waves from the transducer. The sound waves will harmlessly bounce off your heart to allow the heart images to be captured in real-time motion. These images will then be recorded.  You may need an IV to receive a medicine that improves the quality of the pictures. AFTER THE PROCEDURE You may return to your normal schedule including diet, activities, and medicines, unless your health care provider tells you otherwise.   This information is not intended to replace advice given to you by your health care provider. Make sure you discuss any questions you have with your health care provider.   Document Released: 11/11/2000 Document Revised: 12/05/2014 Document Reviewed: 07/22/2013 Elsevier Interactive Patient Education 2016 Elsevier Inc.  

## 2016-09-09 NOTE — Progress Notes (Signed)
Cardiology Office Note   Date:  09/09/2016   ID:  Trevor Santiago, DOB 01/04/1956, MRN [id number]  PCP:  Dortha Kern, PA  Cardiologist:   Lorine Bears, MD   Chief Complaint  Patient presents with  . Aortic valve disorder  . Medication Management      History of Present Illness: Trevor Santiago is a 60 y.o. male who presents for A follow-up visit regarding aortic valve replacement with mechanical aortic valve. He presented in 2009 with symptomatic critical aortic stenosis due to bicuspid aortic valve. He underwent aortic valve replacement with an investigational mechanical aortic valve at [facility] without complications. Cardiac catheterization before surgery showed no significant coronary artery disease with mildly dilated ascending aorta. Maintaining therapeutic INR was extremely difficult and required a very large dose of warfarin. He was evaluated by hematology and ultimately he achieved therapeutic INR on 17.5 mg of warfarin daily He has been doing well overall with no chest pain or shortness of breath. He is complaining of random episodes of dizziness and lightheadedness without syncope or presyncope. This typically do not happen  when he is working and carrying heavy stuff. He continues to smoke a few cigarettes a day.   Past Medical History:  Diagnosis Date  . Back pain   . Bicuspid aortic valve    With severe stenosis diagnosed in 2009. Status post mechanical aortic valve replacement at [facility]  . Leaky heart valve     Past Surgical History:  Procedure Laterality Date  . AORTIC VALVE REPLACEMENT    . EXTERNAL EAR SURGERY       Current Outpatient Prescriptions  Medication Sig Dispense Refill  . metoprolol succinate (TOPROL-XL) 25 MG 24 hr tablet TAKE ONE TABLET BY MOUTH ONCE DAILY 30 tablet 9  . warfarin (COUMADIN) 10 MG tablet TAKE AS DIRECTED BY  COUMADIN  CLINIC 30 tablet 3  . warfarin (COUMADIN) 7.5 MG tablet TAKE AS DIRECTED BY  COUMADIN  CLINIC 30 tablet 1   No  current facility-administered medications for this visit.     Allergies:   Penicillins and Tetracyclines & related    Social History:  The patient  reports that he has been smoking Cigarettes.  He has smoked for the past 0.00 years. He has never used smokeless tobacco. He reports that he drinks alcohol. He reports that he does not use drugs.   Family History:  The patient's family history is not on file.    ROS:  Please see the history of present illness.   Otherwise, review of systems are positive for none.   All other systems are reviewed and negative.    PHYSICAL EXAM: VS:  BP 120/70   Pulse 62   Ht 5\' 9"  (1.753 m)   Wt 184 lb 12.8 oz (83.8 kg)   SpO2 96%   BMI 27.29 kg/m  , BMI Body mass index is 27.29 kg/m. GEN: Well nourished, well developed, in no acute distress  HEENT: normal  Neck: no JVD, carotid bruits, or masses Cardiac: RRR with normal mechanical heart sounds; no  rubs, or gallops,no edema .  2/6 systolic ejection murmur in the aortic area Respiratory:  clear to auscultation bilaterally, normal work of breathing GI: soft, nontender, nondistended, + BS MS: no deformity or atrophy  Skin: warm and dry, no rash Neuro:  Strength and sensation are intact Psych: euthymic mood, full affect   EKG:  EKG is ordered today. The ekg ordered today demonstrates  sinus bradycardia with inferior  T wave changes.   Recent Labs: No results found for requested labs within last 8760 hours.    Lipid Panel No results found for: CHOL, TRIG, HDL, CHOLHDL, VLDL, LDLCALC, LDLDIRECT    Wt Readings from Last 3 Encounters:  09/09/16 184 lb 12.8 oz (83.8 kg)  04/17/15 184 lb 12 oz (83.8 kg)  07/10/14 185 lb 12 oz (84.3 kg)       No flowsheet data found.    ASSESSMENT AND PLAN:  1.  Status post aortic valve replacement with mechanical valve: Currently with no symptoms suggestive of angina or heart failure. However, he is reporting increased episodes of dizziness. Thus, I  requested an echocardiogram to ensure no structural heart abnormalities and the valve. Other possibility could be related to mild bradycardia. Continue to monitor for now.  2. Tobacco use: He is only smoking a few cigarettes a day and I discussed with him the importance of complete smoking cessation.   Disposition:   FU with me in 1 week  Signed,  Lorine BearsMuhammad Purity Irmen, MD  09/09/2016 8:35 AM    [facility]

## 2016-09-28 ENCOUNTER — Ambulatory Visit (INDEPENDENT_AMBULATORY_CARE_PROVIDER_SITE_OTHER): Payer: 59

## 2016-09-28 DIAGNOSIS — I359 Nonrheumatic aortic valve disorder, unspecified: Secondary | ICD-10-CM | POA: Diagnosis not present

## 2016-09-28 DIAGNOSIS — Z7901 Long term (current) use of anticoagulants: Secondary | ICD-10-CM | POA: Diagnosis not present

## 2016-09-28 DIAGNOSIS — Z952 Presence of prosthetic heart valve: Secondary | ICD-10-CM

## 2016-09-28 LAB — POCT INR: INR: 2.3

## 2016-09-29 ENCOUNTER — Encounter: Payer: Self-pay | Admitting: *Deleted

## 2016-09-29 ENCOUNTER — Ambulatory Visit
Admission: EM | Admit: 2016-09-29 | Discharge: 2016-09-29 | Disposition: A | Discharge status: Home / Self Care | Payer: 59 | Attending: Family Medicine | Admitting: Family Medicine

## 2016-09-29 DIAGNOSIS — H1033 Unspecified acute conjunctivitis, bilateral: Secondary | ICD-10-CM | POA: Diagnosis not present

## 2016-09-29 DIAGNOSIS — H05013 Cellulitis of bilateral orbits: Secondary | ICD-10-CM | POA: Diagnosis not present

## 2016-09-29 MED ORDER — CEFUROXIME AXETIL 500 MG PO TABS: 500.0000 mg | ORAL_TABLET | Freq: Two times a day (BID) | ORAL | 0 refills | Status: DC

## 2016-09-29 MED ORDER — FEXOFENADINE-PSEUDOEPHED ER 180-240 MG PO TB24: 1.0000 | ORAL_TABLET | Freq: Every day | ORAL | 0 refills | Status: DC

## 2016-09-29 NOTE — ED Triage Notes (Signed)
Bilat eye redness, drainage, and edema. Right eye matted closed upon waking this am. Onset approx 2 weeks ago with mild symptoms that have gradually worsened. Right eye is now worse than left. Family members recent hx of pink eye.

## 2016-09-29 NOTE — ED Provider Notes (Signed)
[facility]    CSN: [id number] Arrival date & time: 09/29/16  0844     History   Chief Complaint Chief Complaint  Patient presents with  . Conjunctivitis    HPI Trevor Santiago is a 60 y.o. male.   Patient is a history of 8 days of irritation of his left eye states that left eye started getting better with his right eye started becoming irritated and inflamed. Reports always eyes irritated and inflamed but the eyelids both eyes also become red and swollen and irritated. He states that this morning he had to try to get manner out of his right eye. Now the right eye worse than the left. He denies a sore throat does not coughing. He denies any fevers well. Never had trouble like this before. He does smoke a few years ago he had aortic valve replacement he has had an allergy to penicillin and doxycycline. He does have hypertension he's on Coumadin his blood pressure medicine. He denies any significant family medical history pertinent to today's visit   The history is provided by the patient. No language interpreter was used.  Conjunctivitis  This is a new problem. The current episode started more than 1 week ago. The problem occurs constantly. The problem has been gradually worsening. Pertinent negatives include no chest pain, no abdominal pain, no headaches and no shortness of breath. Nothing aggravates the symptoms. Nothing relieves the symptoms. He has tried nothing for the symptoms. The treatment provided no relief.    Past Medical History:  Diagnosis Date  . Back pain   . Bicuspid aortic valve    With severe stenosis diagnosed in 2009. Status post mechanical aortic valve replacement at [facility]  . Leaky heart valve     Patient Active Problem List   Diagnosis Date Noted  . Tobacco use 04/17/2015  . Heart valve replaced by other means 07/30/2014  . Long term (current) use of anticoagulants 07/10/2014  . Aortic valve disorder 07/10/2014  . Elevated blood pressure  07/10/2014    Past Surgical History:  Procedure Laterality Date  . AORTIC VALVE REPLACEMENT    . EXTERNAL EAR SURGERY         Home Medications    Prior to Admission medications   Medication Sig Start Date End Date Taking? Authorizing Provider  metoprolol succinate (TOPROL-XL) 25 MG 24 hr tablet TAKE ONE TABLET BY MOUTH ONCE DAILY 07/13/16  Yes Iran Ouch, MD  warfarin (COUMADIN) 10 MG tablet TAKE AS DIRECTED BY  COUMADIN  CLINIC 05/26/16  Yes Iran Ouch, MD  warfarin (COUMADIN) 7.5 MG tablet TAKE AS DIRECTED BY  COUMADIN  CLINIC 07/13/16  Yes Iran Ouch, MD  cefUROXime (CEFTIN) 500 MG tablet Take 1 tablet (500 mg total) by mouth 2 (two) times daily. 09/29/16   Hassan Rowan, MD  fexofenadine-pseudoephedrine (ALLEGRA-D ALLERGY & CONGESTION) 180-240 MG 24 hr tablet Take 1 tablet by mouth daily. 09/29/16   Hassan Rowan, MD    Family History History reviewed. No pertinent family history.  Social History Social History  Substance Use Topics  . Smoking status: Current Every Day Smoker    Years: 0.00    Types: Cigarettes  . Smokeless tobacco: Never Used  . Alcohol use Yes     Comment: occasional     Allergies   Penicillins and Tetracyclines & related   Review of Systems Review of Systems  HENT: Positive for facial swelling.   Eyes: Positive for pain, discharge, redness and  itching.  Respiratory: Negative for shortness of breath.   Cardiovascular: Negative for chest pain.  Gastrointestinal: Negative for abdominal pain.  Neurological: Negative for headaches.  All other systems reviewed and are negative.    Physical Exam Triage Vital Signs ED Triage Vitals  Enc Vitals Group     BP 09/29/16 0926 (!) 147/68     Pulse Rate 09/29/16 0926 (!) 54     Resp 09/29/16 0926 16     Temp 09/29/16 0926 98 F (36.7 C)     Temp Source 09/29/16 0926 Oral     SpO2 09/29/16 0926 100 %     Weight 09/29/16 0928 181 lb (82.1 kg)     Height 09/29/16 0928 5\' 8"  (1.727 m)      Head Circumference --      Peak Flow --      Pain Score --      Pain Loc --      Pain Edu? --      Excl. in GC? --    No data found.   Updated Vital Signs BP (!) 147/68 (BP Location: Left Arm)   Pulse (!) 54   Temp 98 F (36.7 C) (Oral)   Resp 16   Ht 5\' 8"  (1.727 m)   Wt 181 lb (82.1 kg)   SpO2 100%   BMI 27.52 kg/m   Visual Acuity Right Eye Distance: 20/50 Left Eye Distance: 20/40 Bilateral Distance: 20/30  Right Eye Near:   Left Eye Near:    Bilateral Near:     Physical Exam  Constitutional: He is oriented to person, place, and time. He appears well-developed and well-nourished.  HENT:  Head: Normocephalic and atraumatic.  Right Ear: Hearing, tympanic membrane, external ear and ear canal normal.  Left Ear: Hearing, tympanic membrane, external ear and ear canal normal.  Nose: Mucosal edema present. Right sinus exhibits no maxillary sinus tenderness and no frontal sinus tenderness. Left sinus exhibits no maxillary sinus tenderness and no frontal sinus tenderness.  Mouth/Throat: Uvula is midline and oropharynx is clear and moist. No uvula swelling. No posterior oropharyngeal edema.  Eyes: Pupils are equal, round, and reactive to light. Right eye exhibits discharge. Left eye exhibits discharge.  Neck: Normal range of motion. Neck supple. No thyromegaly present.  Pulmonary/Chest: Effort normal.  Musculoskeletal: Normal range of motion.  Lymphadenopathy:    He has cervical adenopathy.  Neurological: He is alert and oriented to person, place, and time.  Skin: Skin is warm.  Psychiatric: He has a normal mood and affect.  Vitals reviewed.    UC Treatments / Results  Labs (all labs ordered are listed, but only abnormal results are displayed) Labs Reviewed - No data to display  EKG  EKG Interpretation None       Radiology No results found.  Procedures Procedures (including critical care time)  Medications Ordered in UC Medications - No data to  display   Initial Impression / Assessment and Plan / UC Course  I have reviewed the triage vital signs and the nursing notes.  Pertinent labs & imaging results that were available during my care of the patient were reviewed by me and considered in my medical decision making (see chart for details).  Clinical Course    Patient was informed that there is a 1-10,000 chance of allergies or reaction to cephalosporins for people who are allergic to penicillin but I think that he should be on systemic antibiotics both eyes bothering him and with the intensity  changing stop this a foreign object or other problem with the eyes. We'll place him on Ceftin 500 mg 1 tablet twice a day Allegra-D 1 tablet daily. Work note given for today and tomorrow as well. All of his PCP or ophthalmologist of his choice if not better in 1-2 weeks. Recommend stop smoking as well.  Final Clinical Impressions(s) / UC Diagnoses   Final diagnoses:  Acute bacterial conjunctivitis of both eyes  Orbital cellulitis, bilateral    New Prescriptions New Prescriptions   CEFUROXIME (CEFTIN) 500 MG TABLET    Take 1 tablet (500 mg total) by mouth 2 (two) times daily.   FEXOFENADINE-PSEUDOEPHEDRINE (ALLEGRA-D ALLERGY & CONGESTION) 180-240 MG 24 HR TABLET    Take 1 tablet by mouth daily.     Note: This dictation was prepared with Dragon dictation along with smaller phrase technology. Any transcriptional errors that result from this process are unintentional.   Hassan RowanEugene Kendel Pesnell, MD 09/29/16 1027

## 2016-09-30 ENCOUNTER — Ambulatory Visit (INDEPENDENT_AMBULATORY_CARE_PROVIDER_SITE_OTHER): Payer: 59

## 2016-09-30 ENCOUNTER — Other Ambulatory Visit: Payer: Self-pay

## 2016-09-30 DIAGNOSIS — I359 Nonrheumatic aortic valve disorder, unspecified: Secondary | ICD-10-CM | POA: Diagnosis not present

## 2016-10-04 ENCOUNTER — Encounter: Payer: Self-pay | Admitting: Family Medicine

## 2016-10-11 ENCOUNTER — Other Ambulatory Visit: Payer: Self-pay | Admitting: Cardiovascular Disease

## 2016-10-11 NOTE — Telephone Encounter (Signed)
Review for refill. 

## 2016-10-14 ENCOUNTER — Ambulatory Visit (INDEPENDENT_AMBULATORY_CARE_PROVIDER_SITE_OTHER): Payer: 59 | Admitting: Family Medicine

## 2016-10-14 ENCOUNTER — Encounter: Payer: Self-pay | Admitting: Family Medicine

## 2016-10-14 VITALS — BP 142/60 | HR 76 | Temp 98.3°F | Resp 16 | Ht 67.5 in | Wt 187.8 lb

## 2016-10-14 DIAGNOSIS — Z0000 Encounter for general adult medical examination without abnormal findings: Secondary | ICD-10-CM

## 2016-10-14 DIAGNOSIS — I359 Nonrheumatic aortic valve disorder, unspecified: Secondary | ICD-10-CM

## 2016-10-14 DIAGNOSIS — Z1211 Encounter for screening for malignant neoplasm of colon: Secondary | ICD-10-CM

## 2016-10-14 DIAGNOSIS — Z125 Encounter for screening for malignant neoplasm of prostate: Secondary | ICD-10-CM | POA: Diagnosis not present

## 2016-10-14 DIAGNOSIS — Z7901 Long term (current) use of anticoagulants: Secondary | ICD-10-CM | POA: Diagnosis not present

## 2016-10-14 DIAGNOSIS — I10 Essential (primary) hypertension: Secondary | ICD-10-CM

## 2016-10-14 DIAGNOSIS — Z23 Encounter for immunization: Secondary | ICD-10-CM | POA: Diagnosis not present

## 2016-10-14 LAB — POCT URINALYSIS DIPSTICK
BILIRUBIN UA: NEGATIVE
GLUCOSE UA: NEGATIVE
KETONES UA: NEGATIVE
LEUKOCYTES UA: NEGATIVE
NITRITE UA: NEGATIVE
PH UA: 6
Protein, UA: NEGATIVE
Spec Grav, UA: 1.025
Urobilinogen, UA: 0.2

## 2016-10-14 NOTE — Progress Notes (Signed)
Patient: Trevor Santiago, Male    DOB: 12/31/1955, 60 y.o.   MRN: [id number] Visit Date: 10/14/2016  Today's Provider: Dortha Kernennis Chrismon, PA   Chief Complaint  Patient presents with  . Annual Exam   Subjective:    Annual physical exam Trevor JewRoyce D Burgo is a 60 y.o. male who presents today for health maintenance and complete physical. He feels well. He reports exercising only at work.[employer]. He reports he is sleeping average 6 hours per day. Patient works 3rd shift.  -----------------------------------------------------------------   Review of Systems  Constitutional: Negative.   HENT: Negative.   Eyes: Negative.   Respiratory: Negative.   Cardiovascular: Negative.   Gastrointestinal: Negative.   Endocrine: Negative.   Genitourinary: Positive for frequency.  Musculoskeletal: Negative.   Skin: Negative.   Allergic/Immunologic: Negative.   Neurological: Negative.   Hematological: Negative.   Psychiatric/Behavioral: Negative.     Social History      He  reports that he has been smoking Cigarettes.  He has smoked for the past 0.00 years. He has never used smokeless tobacco. He reports that he drinks alcohol. He reports that he does not use drugs.       Social History   Social History  . Marital status: Married    Spouse name: N/A  . Number of children: N/A  . Years of education: N/A   Social History Main Topics  . Smoking status: Current Every Day Smoker    Years: 0.00    Types: Cigarettes  . Smokeless tobacco: Never Used  . Alcohol use Yes     Comment: occasional  . Drug use: No  . Sexual activity: Not Asked   Other Topics Concern  . None   Social History Narrative  . None    Past Medical History:  Diagnosis Date  . Back pain   . Bicuspid aortic valve    With severe stenosis diagnosed in 2009. [facility]  . Leaky heart valve      Patient Active Problem List   Diagnosis Date Noted  . Tobacco use 04/17/2015    . Heart valve replaced by other means 07/30/2014  . Long term (current) use of anticoagulants 07/10/2014  . Aortic valve disorder 07/10/2014  . Elevated blood pressure 07/10/2014    Past Surgical History:  Procedure Laterality Date  . AORTIC VALVE REPLACEMENT    . EXTERNAL EAR SURGERY      Family History        Family Status  Relation Status  . Mother Alive  . Father Deceased        His family history is not on file.     Allergies  Allergen Reactions  . Penicillins   . Tetracyclines & Related      Current Outpatient Prescriptions:  .  fexofenadine-pseudoephedrine (ALLEGRA-D ALLERGY & CONGESTION) 180-240 MG 24 hr tablet, Take 1 tablet by mouth daily., Disp: 30 tablet, Rfl: 0 .  metoprolol succinate (TOPROL-XL) 25 MG 24 hr tablet, TAKE ONE TABLET BY MOUTH ONCE DAILY, Disp: 30 tablet, Rfl: 9 .  warfarin (COUMADIN) 10 MG tablet, TAKE AS DIRECTED BY  COUMADIN  CLINIC, Disp: 30 tablet, Rfl: 3 .  warfarin (COUMADIN) 7.5 MG tablet, TAKE AS DIRECTED BY COUMADIN CLINIC, Disp: 30 tablet, Rfl: 1   Patient Care Team: Tamsen Roersennis E Chrismon, PA as PCP - General (Physician Assistant)      Objective:   Vitals: BP (!) 142/60 (BP Location: Right  Arm, Patient Position: Sitting, Cuff Size: Normal)   Pulse 76   Temp 98.3 F (36.8 C) (Oral)   Resp 16   Ht 5' 7.5" (1.715 m)   Wt 187 lb 12.8 oz (85.2 kg)   BMI 28.98 kg/m    Physical Exam  Constitutional: He is oriented to person, place, and time. He appears well-developed and well-nourished.  HENT:  Head: Normocephalic.  Right Ear: External ear normal.  Left Ear: External ear normal.  Nose: Nose normal.  Mouth/Throat: Oropharynx is clear and moist.  Eyes: Conjunctivae are normal.  Neck: Neck supple. No JVD present. No thyromegaly present.  Cardiovascular: Normal rate and regular rhythm.   Murmur heard. Artificial aortic valve. Grade 2/6 systolic murmur unchanged.  Pulmonary/Chest: Effort normal and breath sounds normal.   Abdominal: Soft. Bowel sounds are normal.  Genitourinary: Rectum normal, prostate normal and penis normal. Rectal exam shows guaiac negative stool.  Musculoskeletal: Normal range of motion. He exhibits no edema.  Lymphadenopathy:    He has no cervical adenopathy.  Neurological: He is alert and oriented to person, place, and time. He has normal reflexes.  Skin: No rash noted.  Psychiatric: He has a normal mood and affect. His behavior is normal. Thought content normal.   Depression Screen PHQ 2/9 Scores 11/14/2016  PHQ - 2 Score 0    Assessment & Plan:     Routine Health Maintenance and Physical Exam  Exercise Activities and Dietary recommendations Goals    Very physically active at work everyday.       Immunization History  Administered Date(s) Administered  . Tdap 10/14/2016    Health Maintenance  Topic Date Due  . Hepatitis C Screening  07-16-1956  . HIV Screening  10/30/1972  . TETANUS/TDAP  10/30/1976  . COLONOSCOPY  10/31/2007  . INFLUENZA VACCINE  06/28/2016     Discussed health benefits of physical activity, and encouraged him to engage in regular exercise appropriate for his age and condition.    --------------------------------------------------------------------  1. Annual physical exam General health good. Continues to work in a very physically active job despite past aortic valve replacement. Recheck labs and given anticipatory counseling as above. - CBC with Differential/Platelet - Comprehensive metabolic panel - POCT urinalysis dipstick - Lipid panel - TSH  2. Long term current use of anticoagulant therapy Presently on 17.5 mg Coumadin daily to maintain therapeutic anticoagulation. Last INR was 2.3 on 09-28-16 and followed monthly by cardiologist (Dr. Kirke CorinArida). Recheck routine labs. Small hemolyzed amount of blood in urinalysis. No gross bleeding episodes. - CBC with Differential/Platelet - Comprehensive metabolic panel - POCT urinalysis  dipstick  3. Aortic valve disorder Had aortic valve replacement in 2009 and followed by Dr. Kirke CorinArida (cardiologist) regularly. Denies chest pains, dyspnea or edema. Presently on Coumadin daily. Continue follow up with cardiologist. - Lipid panel  4. Screening PSA (prostate specific antigen) Denies nocturia but has notice and increase in frequency. Will check PSA. Essentially normal DRE. - PSA  5. Colon cancer screening Due for colonoscopy but refuses. Will get Cologuard scheduled.  6. Essential hypertension Stable and well controlled with Metoprolol-XL 25 mg qd. Recheck routine labs and follow up pending reports. - CBC with Differential/Platelet - Comprehensive metabolic panel - POCT urinalysis dipstick - Lipid panel - TSH  7. Need for prophylactic vaccination or inoculation against diphtheria and tetanus - Tdap vaccine greater than or equal to 7yo IM    Dortha Kernennis Chrismon, PA  [facility]

## 2016-10-15 LAB — COMPREHENSIVE METABOLIC PANEL
ALBUMIN: 4.6 g/dL (ref 3.5–5.5)
ALK PHOS: 44 IU/L (ref 39–117)
ALT: 31 IU/L (ref 0–44)
AST: 29 IU/L (ref 0–40)
Albumin/Globulin Ratio: 1.6 (ref 1.2–2.2)
BILIRUBIN TOTAL: 0.5 mg/dL (ref 0.0–1.2)
BUN / CREAT RATIO: 13 (ref 9–20)
BUN: 13 mg/dL (ref 6–24)
CHLORIDE: 99 mmol/L (ref 96–106)
CO2: 24 mmol/L (ref 18–29)
Calcium: 10 mg/dL (ref 8.7–10.2)
Creatinine, Ser: 0.99 mg/dL (ref 0.76–1.27)
GFR calc Af Amer: 97 mL/min/{1.73_m2} (ref >59)
GFR calc non Af Amer: 84 mL/min/{1.73_m2} (ref >59)
GLOBULIN, TOTAL: 2.9 g/dL (ref 1.5–4.5)
GLUCOSE: 74 mg/dL (ref 65–99)
POTASSIUM: 4.3 mmol/L (ref 3.5–5.2)
SODIUM: 138 mmol/L (ref 134–144)
Total Protein: 7.5 g/dL (ref 6.0–8.5)

## 2016-10-15 LAB — CBC WITH DIFFERENTIAL/PLATELET
BASOS ABS: 0 10*3/uL (ref 0.0–0.2)
Basos: 0 %
EOS (ABSOLUTE): 0.7 10*3/uL — ABNORMAL HIGH (ref 0.0–0.4)
Eos: 11 %
HEMOGLOBIN: 13.6 g/dL (ref 12.6–17.7)
Hematocrit: 41.9 % (ref 37.5–51.0)
Immature Grans (Abs): 0 10*3/uL (ref 0.0–0.1)
Immature Granulocytes: 0 %
LYMPHS ABS: 1.3 10*3/uL (ref 0.7–3.1)
Lymphs: 22 %
MCH: 29.1 pg (ref 26.6–33.0)
MCHC: 32.5 g/dL (ref 31.5–35.7)
MCV: 90 fL (ref 79–97)
MONOCYTES: 9 %
Monocytes Absolute: 0.5 10*3/uL (ref 0.1–0.9)
NEUTROS ABS: 3.5 10*3/uL (ref 1.4–7.0)
Neutrophils: 58 %
Platelets: 231 10*3/uL (ref 150–379)
RBC: 4.68 x10E6/uL (ref 4.14–5.80)
RDW: 15.9 % — ABNORMAL HIGH (ref 12.3–15.4)
WBC: 6 10*3/uL (ref 3.4–10.8)

## 2016-10-15 LAB — LIPID PANEL
CHOLESTEROL TOTAL: 228 mg/dL — AB (ref 100–199)
Chol/HDL Ratio: 3 ratio units (ref 0.0–5.0)
HDL: 75 mg/dL (ref >39)
LDL Calculated: 137 mg/dL — ABNORMAL HIGH (ref 0–99)
Triglycerides: 82 mg/dL (ref 0–149)
VLDL Cholesterol Cal: 16 mg/dL (ref 5–40)

## 2016-10-15 LAB — TSH: TSH: 1.09 u[IU]/mL (ref 0.450–4.500)

## 2016-10-15 LAB — PSA: PROSTATE SPECIFIC AG, SERUM: 3.5 ng/mL (ref 0.0–4.0)

## 2016-10-17 ENCOUNTER — Telehealth: Payer: Self-pay

## 2016-10-17 MED ORDER — SIMVASTATIN 20 MG PO TABS: 20.0000 mg | ORAL_TABLET | Freq: Every day | ORAL | 3 refills | Status: DC

## 2016-10-17 NOTE — Telephone Encounter (Signed)
Left detailed message on voicemail ( per chart okay to leave message) . [facility] [place]. KW

## 2016-10-17 NOTE — Telephone Encounter (Signed)
-----   Message from Tamsen Roersennis E Chrismon, [place] sent at 10/17/2016  7:56 AM EST ----- All blood tests are normal except LDL cholesterol high (goal <100). Recommend Simvastatin 20 mg qd #30 & 3 RF. Recheck levels in 3 months. Follow low fat diet.

## 2016-11-09 ENCOUNTER — Ambulatory Visit (INDEPENDENT_AMBULATORY_CARE_PROVIDER_SITE_OTHER): Payer: 59

## 2016-11-09 DIAGNOSIS — Z952 Presence of prosthetic heart valve: Secondary | ICD-10-CM

## 2016-11-09 DIAGNOSIS — I359 Nonrheumatic aortic valve disorder, unspecified: Secondary | ICD-10-CM | POA: Diagnosis not present

## 2016-11-09 DIAGNOSIS — Z7901 Long term (current) use of anticoagulants: Secondary | ICD-10-CM

## 2016-11-09 LAB — POCT INR: INR: 2.2

## 2016-11-24 ENCOUNTER — Other Ambulatory Visit: Payer: Self-pay | Admitting: Cardiovascular Disease

## 2016-11-24 NOTE — Telephone Encounter (Signed)
Please review for refill. Thanks!  

## 2016-12-21 ENCOUNTER — Ambulatory Visit (INDEPENDENT_AMBULATORY_CARE_PROVIDER_SITE_OTHER): Payer: 59 | Admitting: *Deleted

## 2016-12-21 DIAGNOSIS — Z952 Presence of prosthetic heart valve: Secondary | ICD-10-CM

## 2016-12-21 DIAGNOSIS — Z7901 Long term (current) use of anticoagulants: Secondary | ICD-10-CM

## 2016-12-21 DIAGNOSIS — I359 Nonrheumatic aortic valve disorder, unspecified: Secondary | ICD-10-CM

## 2016-12-21 LAB — POCT INR: INR: 2

## 2017-01-10 ENCOUNTER — Other Ambulatory Visit: Payer: Self-pay | Admitting: Cardiovascular Disease

## 2017-01-10 NOTE — Telephone Encounter (Signed)
Please review for refill. Thanks!  

## 2017-01-16 ENCOUNTER — Encounter: Payer: Self-pay | Admitting: *Deleted

## 2017-01-16 ENCOUNTER — Ambulatory Visit: Payer: Self-pay | Admitting: Physician Assistant

## 2017-01-16 ENCOUNTER — Ambulatory Visit
Admission: EM | Admit: 2017-01-16 | Discharge: 2017-01-16 | Disposition: A | Discharge status: Home / Self Care | Payer: 59 | Attending: Family Medicine | Admitting: Family Medicine

## 2017-01-16 DIAGNOSIS — R42 Dizziness and giddiness: Secondary | ICD-10-CM

## 2017-01-16 MED ORDER — MECLIZINE HCL 25 MG PO TABS: 25.0000 mg | ORAL_TABLET | Freq: Three times a day (TID) | ORAL | 0 refills | Status: DC | PRN

## 2017-01-16 NOTE — ED Provider Notes (Signed)
[facility]    CSN: [id number] Arrival date & time: 01/16/17  7829     History   Chief Complaint Chief Complaint  Patient presents with  . Dizziness  . Nasal Congestion    HPI Trevor Santiago is a 61 y.o. male.   Patient and his wife are here because he states that last night he became extremely dizzy when he laid down. States he moved his head while playing down or to decide if things get worse as well. He has has some dizziness and some lightheadedness before the past but nothing like that. Before when he had dizziness and lightheadedness is ongoing last for brief time that it would improve. He's had a valve repair done she has cardiologist. He mentioned to the cardiologist about episodes of dizziness and states had before in the past and the cardiologist did an echocardiogram on his heart and told him that he felt that he was fine and it was not cardiovascular problem. Once again this dizziness episodes were used to be very brief and very mild but that last night he had the grand episode of dizziness and states he felt miserable and  Horrible.  He actually feels somewhat better at this time. He states we moved his head when he was gained down the dizziness got bad as well. 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 He does not have the ears nose and throat doctor that he follows long-term since he was seen at [facility] for that problem. He is allergic penicillin and doxycycline. He still smokes he has hypertension. Past family history noncontributory to visit.   The history is provided by the patient and the spouse. No language interpreter was used.  Dizziness  Quality:  Room spinning Severity:  Severe Onset quality:  Sudden Timing:  Intermittent Progression:  Partially resolved Chronicity:  Recurrent Context: head  movement and inactivity   Relieved by:  Nothing Worsened by:  Lying down and turning head Ineffective treatments:  Change in position Risk factors: hx of vertigo     Past Medical History:  Diagnosis Date  . Back pain   . Bicuspid aortic valve    With severe stenosis diagnosed in 2009. Status post mechanical aortic valve replacement at [facility]  . Leaky heart valve     Patient Active Problem List   Diagnosis Date Noted  . Tobacco use 04/17/2015  . Heart valve replaced by other means 07/30/2014  . Long term current use of anticoagulant therapy 07/10/2014  . Aortic valve disorder 07/10/2014  . Essential hypertension 07/10/2014    Past Surgical History:  Procedure Laterality Date  . AORTIC VALVE REPLACEMENT    . EXTERNAL EAR SURGERY         Home Medications    Prior to Admission medications   Medication Sig Start Date End Date Taking? Authorizing Provider  metoprolol succinate (TOPROL-XL) 25 MG 24 hr tablet TAKE ONE TABLET BY MOUTH ONCE DAILY 07/13/16  Yes Iran Ouch, MD  warfarin (COUMADIN) 10 MG tablet TAKE AS DIRECTED BY  COUMADIN  CLINIC 11/24/16  Yes Iran Ouch, MD  warfarin (COUMADIN) 7.5 MG tablet TAKE AS DIRECTED BY  COUMADIN  CLINIC 01/10/17  Yes Iran Ouch, MD  fexofenadine-pseudoephedrine (ALLEGRA-D ALLERGY & CONGESTION) 180-240 MG 24 hr tablet Take 1 tablet by mouth daily.  09/29/16   Hassan RowanEugene Loudon Krakow, MD  meclizine (ANTIVERT) 25 MG tablet Take 1 tablet (25 mg total) by mouth 3 (three) times daily as needed for dizziness. 01/16/17   Hassan RowanEugene Yonathan Perrow, MD  simvastatin (ZOCOR) 20 MG tablet Take 1 tablet (20 mg total) by mouth at bedtime. 10/17/16   Tamsen Roersennis E Chrismon, PA    Family History History reviewed. No pertinent family history.  Social History Social History  Substance Use Topics  . Smoking status: Current Every Day Smoker    Years: 0.00    Types: Cigarettes  . Smokeless tobacco: Never Used  . Alcohol use Yes     Comment: occasional     Allergies    Penicillins and Tetracyclines & related   Review of Systems Review of Systems  Neurological: Positive for dizziness.  All other systems reviewed and are negative.    Physical Exam Triage Vital Signs ED Triage Vitals  Enc Vitals Group     BP 01/16/17 0844 (!) 146/68     Pulse Rate 01/16/17 0844 (!) 58     Resp 01/16/17 0844 16     Temp 01/16/17 0844 98 F (36.7 C)     Temp Source 01/16/17 0844 Oral     SpO2 01/16/17 0844 100 %     Weight 01/16/17 0845 180 lb (81.6 kg)     Height 01/16/17 0845 5\' 8"  (1.727 m)     Head Circumference --      Peak Flow --      Pain Score 01/16/17 0849 0     Pain Loc --      Pain Edu? --      Excl. in GC? --    No data found.   Updated Vital Signs BP (!) 146/68 (BP Location: Right Arm)   Pulse (!) 58   Temp 98 F (36.7 C) (Oral)   Resp 16   Ht 5\' 8"  (1.727 m)   Wt 180 lb (81.6 kg)   SpO2 100%   BMI 27.37 kg/m   Visual Acuity Right Eye Distance:   Left Eye Distance:   Bilateral Distance:    Right Eye Near:   Left Eye Near:    Bilateral Near:     Physical Exam  Constitutional: He is oriented to person, place, and time. He appears well-developed and well-nourished.  HENT:  Head: Normocephalic and atraumatic.  Right Ear: Hearing, tympanic membrane, external ear and ear canal normal.  Left Ear: Hearing, tympanic membrane, external ear and ear canal normal.  Nose: Nose normal. No mucosal edema.  Mouth/Throat: Uvula is midline and oropharynx is clear and moist. Normal dentition.  Eyes: Conjunctivae, EOM and lids are normal.  Neck: Trachea normal, normal range of motion and full passive range of motion without pain.  Cardiovascular: Normal rate, regular rhythm and normal heart sounds.   Pulmonary/Chest: Effort normal and breath sounds normal.  Musculoskeletal: Normal range of motion.  Neurological: He is alert and oriented to person, place, and time.  Skin: Skin is warm.  Psychiatric: He has a normal mood and affect.  Vitals  reviewed.    UC Treatments / Results  Labs (all labs ordered are listed, but only abnormal results are displayed) Labs Reviewed - No data to display  EKG  EKG Interpretation None       Radiology No results found.  Procedures Procedures (including critical care time)  Medications Ordered in UC Medications - No data to display   Initial Impression / Assessment and Plan / UC  Course  I have reviewed the triage vital signs and the nursing notes.  Pertinent labs & imaging results that were available during my care of the patient were reviewed by me and considered in my medical decision making (see chart for details).   attempts using the Epley maneuver was basically unsuccessful and unremarkable. In fact laying down reproduces no side effects at this time. Splint him and his wife are concerned is that this is severe vertigo that is episodic. He states his Rabun evaluated by the cardiologist saw him recommending he go back to his PCP for ENT referral and further evaluation. We'll given prescription for Antivert to use on a when necessary basis and will also give him a work note for today.    Final Clinical Impressions(s) / UC Diagnoses   Final diagnoses:  Dizziness  Vertigo    New Prescriptions New Prescriptions   MECLIZINE (ANTIVERT) 25 MG TABLET    Take 1 tablet (25 mg total) by mouth 3 (three) times daily as needed for dizziness.     Hassan Rowan, MD 01/16/17 [id number]

## 2017-01-16 NOTE — ED Triage Notes (Signed)
Patient started having cold type symptoms of nasal congestion sinus pressure 2 days ago. Last PM patient took cold medicine that caused symptom of vertigo.

## 2017-02-01 ENCOUNTER — Ambulatory Visit (INDEPENDENT_AMBULATORY_CARE_PROVIDER_SITE_OTHER): Payer: 59

## 2017-02-01 DIAGNOSIS — I359 Nonrheumatic aortic valve disorder, unspecified: Secondary | ICD-10-CM

## 2017-02-01 DIAGNOSIS — Z952 Presence of prosthetic heart valve: Secondary | ICD-10-CM

## 2017-02-01 DIAGNOSIS — Z7901 Long term (current) use of anticoagulants: Secondary | ICD-10-CM

## 2017-02-01 LAB — POCT INR: INR: 3

## 2017-02-08 ENCOUNTER — Ambulatory Visit
Admission: EM | Admit: 2017-02-08 | Discharge: 2017-02-08 | Disposition: A | Discharge status: Home / Self Care | Payer: 59 | Attending: Family Medicine | Admitting: Family Medicine

## 2017-02-08 DIAGNOSIS — R6889 Other general symptoms and signs: Secondary | ICD-10-CM | POA: Diagnosis not present

## 2017-02-08 DIAGNOSIS — J111 Influenza due to unidentified influenza virus with other respiratory manifestations: Secondary | ICD-10-CM

## 2017-02-08 DIAGNOSIS — R69 Illness, unspecified: Secondary | ICD-10-CM

## 2017-02-08 MED ORDER — BENZONATATE 200 MG PO CAPS: 200.0000 mg | ORAL_CAPSULE | Freq: Three times a day (TID) | ORAL | 0 refills | Status: DC | PRN

## 2017-02-08 MED ORDER — FEXOFENADINE-PSEUDOEPHED ER 180-240 MG PO TB24: 1.0000 | ORAL_TABLET | Freq: Every day | ORAL | 0 refills | Status: DC

## 2017-02-08 MED ORDER — OSELTAMIVIR PHOSPHATE 75 MG PO CAPS: 75.0000 mg | ORAL_CAPSULE | Freq: Two times a day (BID) | ORAL | 0 refills | Status: DC

## 2017-02-08 NOTE — ED Triage Notes (Signed)
Patient complains of body aches, cough, fever x 3 days. Patient states that his fever this morning was 101.

## 2017-02-08 NOTE — ED Provider Notes (Signed)
[facility]    CSN: [id number] Arrival date & time: 02/08/17  8295     History   Chief Complaint Chief Complaint  Patient presents with  . Fever    HPI Trevor Santiago is a 61 y.o. male.   Patient's here because of flulike symptoms. He states that Monday evening he started aching coughing he has itchy throat and not really sore. He reports having a fever Monday night and Tuesday night and achiness gotten worse. He did not get his flu shot this year. Mr. 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 Along with in the ear surgery he still smokes as well. He is allergic doxycycline and penicillins. No pertinent family medical history relevant to today's visit.   The history is provided by the patient. No language interpreter was used.  Fever  Temp source:  Subjective Onset quality:  Sudden Timing:  Constant Progression:  Worsening Chronicity:  New Relieved by:  Nothing Worsened by:  Nothing Ineffective treatments:  None tried Associated symptoms: congestion, cough, myalgias and sore throat   Influenza  Presenting symptoms: cough, fever, myalgias and sore throat   Severity:  Mild Onset quality:  Sudden Associated symptoms: nasal congestion     Past Medical History:  Diagnosis Date  . Back pain   . Bicuspid aortic valve    With severe stenosis diagnosed in 2009. Status post mechanical aortic valve replacement at [facility]  . Leaky heart valve     Patient Active Problem List   Diagnosis Date Noted  . Tobacco use 04/17/2015  . Heart valve replaced by other means 07/30/2014  . Long term current use of anticoagulant therapy 07/10/2014  . Aortic valve disorder 07/10/2014  . Essential hypertension 07/10/2014    Past Surgical History:  Procedure Laterality Date  . AORTIC VALVE REPLACEMENT    . EXTERNAL EAR SURGERY         Home Medications    Prior to Admission medications   Medication Sig  Start Date End Date Taking? Authorizing Provider  meclizine (ANTIVERT) 25 MG tablet Take 1 tablet (25 mg total) by mouth 3 (three) times daily as needed for dizziness. 01/16/17  Yes Hassan Rowan, MD  metoprolol succinate (TOPROL-XL) 25 MG 24 hr tablet TAKE ONE TABLET BY MOUTH ONCE DAILY 07/13/16  Yes Iran Ouch, MD  warfarin (COUMADIN) 10 MG tablet TAKE AS DIRECTED BY  COUMADIN  CLINIC 11/24/16  Yes Iran Ouch, MD  warfarin (COUMADIN) 7.5 MG tablet TAKE AS DIRECTED BY  COUMADIN  CLINIC 01/10/17  Yes Iran Ouch, MD  benzonatate (TESSALON) 200 MG capsule Take 1 capsule (200 mg total) by mouth 3 (three) times daily as needed. 02/08/17   Hassan Rowan, MD  fexofenadine-pseudoephedrine (ALLEGRA-D ALLERGY & CONGESTION) 180-240 MG 24 hr tablet Take 1 tablet by mouth daily. 02/08/17   Hassan Rowan, MD  oseltamivir (TAMIFLU) 75 MG capsule Take 1 capsule (75 mg total) by mouth 2 (two) times daily. 02/08/17   Hassan Rowan, MD  simvastatin (ZOCOR) 20 MG tablet Take 1 tablet (20 mg total) by mouth at bedtime. 10/17/16   Tamsen Roers, PA    Family History History reviewed. No pertinent family history.  Social History Social History  Substance Use Topics  . Smoking status: Current Every Day Smoker    Years: 0.00    Types: Cigarettes  . Smokeless tobacco: Never Used  . Alcohol use Yes  Comment: occasional     Allergies   Penicillins and Tetracyclines & related   Review of Systems Review of Systems  Constitutional: Positive for fever.  HENT: Positive for congestion and sore throat.   Respiratory: Positive for cough.   Musculoskeletal: Positive for myalgias.  All other systems reviewed and are negative.    Physical Exam Triage Vital Signs ED Triage Vitals  Enc Vitals Group     BP 02/08/17 0840 137/70     Pulse Rate 02/08/17 0840 85     Resp 02/08/17 0840 18     Temp 02/08/17 0840 (!) 100.6 F (38.1 C)     Temp Source 02/08/17 0840 Oral     SpO2 02/08/17 0840 100 %      Weight 02/08/17 0837 180 lb (81.6 kg)     Height 02/08/17 0837 5\' 9"  (1.753 m)     Head Circumference --      Peak Flow --      Pain Score 02/08/17 0838 4     Pain Loc --      Pain Edu? --      Excl. in GC? --    No data found.   Updated Vital Signs BP 137/70 (BP Location: Left Arm)   Pulse 85   Temp (!) 100.6 F (38.1 C) (Oral)   Resp 18   Ht 5\' 9"  (1.753 m)   Wt 180 lb (81.6 kg)   SpO2 100%   BMI 26.58 kg/m   Visual Acuity Right Eye Distance:   Left Eye Distance:   Bilateral Distance:    Right Eye Near:   Left Eye Near:    Bilateral Near:     Physical Exam  Constitutional: He is oriented to person, place, and time. He appears well-developed and well-nourished.  HENT:  Head: Normocephalic and atraumatic.  Right Ear: External ear normal.  Left Ear: External ear normal.  Eyes: Pupils are equal, round, and reactive to light.  Neck: Normal range of motion. Neck supple.  Pulmonary/Chest: Effort normal.  Musculoskeletal: Normal range of motion.  Neurological: He is alert and oriented to person, place, and time.  Skin: Skin is warm.  Psychiatric: He has a normal mood and affect.  Vitals reviewed.    UC Treatments / Results  Labs (all labs ordered are listed, but only abnormal results are displayed) Labs Reviewed - No data to display  EKG  EKG Interpretation None       Radiology No results found.  Procedures Procedures (including critical care time)  Medications Ordered in UC Medications - No data to display   Initial Impression / Assessment and Plan / UC Course  I have reviewed the triage vital signs and the nursing notes.  Pertinent labs & imaging results that were available during my care of the patient were reviewed by me and considered in my medical decision making (see chart for details). Thing to patient that we'll do a flu test per [facility]'s recommendation.I would recommend treating him for the flu and he is in agreement with that we'll place  him on Tamiflu 75 mg twice a day Allegra-D once a day also Tessalon Perles 20 mg per cough. We'll give him work note for today and tomorrow as well. Follow-up with PCP in a week if not better  Final Clinical Impressions(s) / UC Diagnoses   Final diagnoses:  Influenza-like illness  Flu-like symptoms    New Prescriptions New Prescriptions   BENZONATATE (TESSALON) 200 MG CAPSULE    Take  1 capsule (200 mg total) by mouth 3 (three) times daily as needed.   OSELTAMIVIR (TAMIFLU) 75 MG CAPSULE    Take 1 capsule (75 mg total) by mouth 2 (two) times daily.     Note: This dictation was prepared with Dragon dictation along with smaller phrase technology. Any transcriptional errors that result from this process are unintentional.   Hassan Rowan, MD 02/08/17 [phone]

## 2017-03-01 ENCOUNTER — Other Ambulatory Visit: Payer: Self-pay | Admitting: *Deleted

## 2017-03-01 MED ORDER — WARFARIN SODIUM 7.5 MG PO TABS: ORAL_TABLET | ORAL | 3 refills | Status: DC

## 2017-03-20 ENCOUNTER — Encounter: Payer: Self-pay | Admitting: Family Medicine

## 2017-03-20 ENCOUNTER — Ambulatory Visit (INDEPENDENT_AMBULATORY_CARE_PROVIDER_SITE_OTHER): Payer: 59 | Admitting: Family Medicine

## 2017-03-20 VITALS — BP 138/60 | HR 67 | Temp 98.1°F | Wt 182.2 lb

## 2017-03-20 DIAGNOSIS — Z952 Presence of prosthetic heart valve: Secondary | ICD-10-CM | POA: Diagnosis not present

## 2017-03-20 DIAGNOSIS — Z7901 Long term (current) use of anticoagulants: Secondary | ICD-10-CM

## 2017-03-20 DIAGNOSIS — M5432 Sciatica, left side: Secondary | ICD-10-CM | POA: Diagnosis not present

## 2017-03-20 MED ORDER — CELECOXIB 200 MG PO CAPS: 200.0000 mg | ORAL_CAPSULE | Freq: Two times a day (BID) | ORAL | 0 refills | Status: DC

## 2017-03-20 NOTE — Progress Notes (Signed)
Patient: Trevor Santiago Male    DOB: [birth year]   61 y.o.   MRN: [id number] Visit Date: 03/20/2017  Today's Provider: Dortha Kern, PA   Chief Complaint  Patient presents with  . Back Pain   Subjective:    Back Pain  This is a recurrent problem. Episode onset: Friday. The problem occurs constantly. The pain is present in the lumbar spine. The quality of the pain is described as shooting. Radiates to: left leg and foot. The pain is the same all the time. Exacerbated by: lifting. Associated symptoms include leg pain and tingling. He has tried NSAIDs for the symptoms.  Patient has a PMH of left side sciatica.   Patient Active Problem List   Diagnosis Date Noted  . Tobacco use 04/17/2015  . Heart valve replaced by other means 07/30/2014  . Long term current use of anticoagulant therapy 07/10/2014  . Aortic valve disorder 07/10/2014  . Essential hypertension 07/10/2014   Past Surgical History:  Procedure Laterality Date  . AORTIC VALVE REPLACEMENT    . EXTERNAL EAR SURGERY     No family history on file. Allergies  Allergen Reactions  . Penicillins   . Tetracyclines & Related      Previous Medications   METOPROLOL SUCCINATE (TOPROL-XL) 25 MG 24 HR TABLET    TAKE ONE TABLET BY MOUTH ONCE DAILY   SIMVASTATIN (ZOCOR) 20 MG TABLET    Take 1 tablet (20 mg total) by mouth at bedtime.   WARFARIN (COUMADIN) 10 MG TABLET    TAKE AS DIRECTED BY  COUMADIN  CLINIC   WARFARIN (COUMADIN) 7.5 MG TABLET    TAKE AS DIRECTED BY  COUMADIN  CLINIC    Review of Systems  Constitutional: Negative.   Respiratory: Negative.   Cardiovascular: Negative.   Musculoskeletal: Positive for back pain.  Neurological: Positive for tingling.    Social History  Substance Use Topics  . Smoking status: Current Every Day Smoker    Years: 0.00    Types: Cigarettes  . Smokeless tobacco: Never Used  . Alcohol use Yes     Comment: occasional   Objective:   BP 138/60 (BP Location: Right Arm, Patient  Position: Sitting, Cuff Size: Normal)   Pulse 67   Temp 98.1 F (36.7 C) (Oral)   Wt 182 lb 3.2 oz (82.6 kg)   SpO2 98%   BMI 26.91 kg/m   Physical Exam  Constitutional: He is oriented to person, place, and time. He appears well-developed and well-nourished. No distress.  HENT:  Head: Normocephalic and atraumatic.  Right Ear: Hearing normal.  Left Ear: Hearing normal.  Nose: Nose normal.  Eyes: Conjunctivae and lids are normal. Right eye exhibits no discharge. Left eye exhibits no discharge. No scleral icterus.  Cardiovascular: Normal rate and regular rhythm.   Murmur heard. Artificial aortic valve. Grade 2/6 systolic murmur unchanged.  Pulmonary/Chest: Effort normal. No respiratory distress.  Musculoskeletal: Normal range of motion. He exhibits tenderness.  Some tenderness along the left sciatic notch radiating down the left posterior thigh to the calf. SLR's 90 degrees without pain. No muscle weakness. Good pulses bilaterally throughout.   Neurological: He is alert and oriented to person, place, and time. He has normal reflexes.  Skin: Skin is intact. No lesion and no rash noted.  Psychiatric: He has a normal mood and affect. His speech is normal and behavior is normal. Thought content normal.    Assessment & Plan:     1. 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 Has been doing this constant lifting at work the past 8 years. Describes and ache and tingling radiating down the left posterior leg to the calf/ankle area. Has had similar episodes in 2015 that was helped by the use of Celebrex without significant changes in anticoagulant therapy. Will get protime/INR in 2 days. He will let the anticoagulant clinic know he will use this again for a few days to alleviate the sciatic inflammation. Will take a 4-5 day break from work and no lifting at home. Recheck prn. Advised to watch for any signs of blood getting too thin. -  celecoxib (CELEBREX) 200 MG capsule; Take 1 capsule (200 mg total) by mouth 2 (two) times daily.  Dispense: 30 capsule; Refill: 0  2. History of prosthetic aortic valve Had the aortic valve replacement in 2009. Murmur unchanged and no dyspnea or peripheral edema. Continues follow up with cardiologist (Dr. Kirke Corin) every 6 months and on anticoagulant.  3. Long term current use of anticoagulant therapy  Last protime INR was 3.0 and still on Coumadin 17.5 mg qd per Dr. Kirke Corin (cardiologist). Will recheck level in 2 days.

## 2017-03-20 NOTE — Patient Instructions (Signed)
Sciatica Sciatica is pain, numbness, weakness, or tingling along the path of the sciatic nerve. The sciatic nerve starts in the lower back and runs down the back of each leg. The nerve controls the muscles in the lower leg and in the back of the knee. It also provides feeling (sensation) to the back of the thigh, the lower leg, and the sole of the foot. Sciatica is a symptom of another medical condition that pinches or puts pressure on the sciatic nerve. Generally, sciatica only affects one side of the body. Sciatica usually goes away on its own or with treatment. In some cases, sciatica may keep coming back (recur). What are the causes? This condition is caused by pressure on the sciatic nerve, or pinching of the sciatic nerve. This may be the result of:  A disk in between the bones of the spine (vertebrae) bulging out too far (herniated disk).  Age-related changes in the spinal disks (degenerative disk disease).  A pain disorder that affects a muscle in the buttock (piriformis syndrome).  Extra bone growth (bone spur) near the sciatic nerve.  An injury or break (fracture) of the pelvis.  Pregnancy.  Tumor (rare). What increases the risk? The following factors may make you more likely to develop this condition:  Playing sports that place pressure or stress on the spine, such as football or weight lifting.  Having poor strength and flexibility.  A history of back injury.  A history of back surgery.  Sitting for long periods of time.  Doing activities that involve repetitive bending or lifting.  Obesity. What are the signs or symptoms? Symptoms can vary from mild to very severe, and they may include:  Any of these problems in the lower back, leg, hip, or buttock:  Mild tingling or dull aches.  Burning sensations.  Sharp pains.  Numbness in the back of the calf or the sole of the foot.  Leg weakness.  Severe back pain that makes movement difficult. These symptoms may  get worse when you cough, sneeze, or laugh, or when you sit or stand for long periods of time. Being overweight may also make symptoms worse. In some cases, symptoms may recur over time. How is this diagnosed? This condition may be diagnosed based on:  Your symptoms.  A physical exam. Your health care provider may ask you to do certain movements to check whether those movements trigger your symptoms.  You may have tests, including:  Blood tests.  X-rays.  MRI.  CT scan. How is this treated? In many cases, this condition improves on its own, without any treatment. However, treatment may include:  Reducing or modifying physical activity during periods of pain.  Exercising and stretching to strengthen your abdomen and improve the flexibility of your spine.  Icing and applying heat to the affected area.  Medicines that help:  To relieve pain and swelling.  To relax your muscles.  Injections of medicines that help to relieve pain, irritation, and inflammation around the sciatic nerve (steroids).  Surgery. Follow these instructions at home: Medicines   Take over-the-counter and prescription medicines only as told by your health care provider.  Do not drive or operate heavy machinery while taking prescription pain medicine. Managing pain   If directed, apply ice to the affected area.  Put ice in a plastic bag.  Place a towel between your skin and the bag.  Leave the ice on for 20 minutes, 2-3 times a day.  After icing, apply heat to the   affected area before you exercise or as often as told by your health care provider. Use the heat source that your health care provider recommends, such as a moist heat pack or a heating pad.  Place a towel between your skin and the heat source.  Leave the heat on for 20-30 minutes.  Remove the heat if your skin turns bright red. This is especially important if you are unable to feel pain, heat, or cold. You may have a greater risk of  getting burned. Activity   Return to your normal activities as told by your health care provider. Ask your health care provider what activities are safe for you.  Avoid activities that make your symptoms worse.  Take brief periods of rest throughout the day. Resting in a lying or standing position is usually better than sitting to rest.  When you rest for longer periods, mix in some mild activity or stretching between periods of rest. This will help to prevent stiffness and pain.  Avoid sitting for long periods of time without moving. Get up and move around at least one time each hour.  Exercise and stretch regularly, as told by your health care provider.  Do not lift anything that is heavier than 10 lb (4.5 kg) while you have symptoms of sciatica. When you do not have symptoms, you should still avoid heavy lifting, especially repetitive heavy lifting.  When you lift objects, always use proper lifting technique, which includes:  Bending your knees.  Keeping the load close to your body.  Avoiding twisting. General instructions   Use good posture.  Avoid leaning forward while sitting.  Avoid hunching over while standing.  Maintain a healthy weight. Excess weight puts extra stress on your back and makes it difficult to maintain good posture.  Wear supportive, comfortable shoes. Avoid wearing high heels.  Avoid sleeping on a mattress that is too soft or too hard. A mattress that is firm enough to support your back when you sleep may help to reduce your pain.  Keep all follow-up visits as told by your health care provider. This is important. Contact a health care provider if:  You have pain that wakes you up when you are sleeping.  You have pain that gets worse when you lie down.  Your pain is worse than you have experienced in the past.  Your pain lasts longer than 4 weeks.  You experience unexplained weight loss. Get help right away if:  You lose control of your bowel  or bladder (incontinence).  You have:  Weakness in your lower back, pelvis, buttocks, or legs that gets worse.  Redness or swelling of your back.  A burning sensation when you urinate. This information is not intended to replace advice given to you by your health care provider. Make sure you discuss any questions you have with your health care provider. Document Released: 11/08/2001 Document Revised: 04/19/2016 Document Reviewed: 07/24/2015 Elsevier Interactive Patient Education  2017 [employer] Inc[employer]  

## 2017-03-22 ENCOUNTER — Ambulatory Visit (INDEPENDENT_AMBULATORY_CARE_PROVIDER_SITE_OTHER): Payer: 59

## 2017-03-22 DIAGNOSIS — Z952 Presence of prosthetic heart valve: Secondary | ICD-10-CM

## 2017-03-22 DIAGNOSIS — I359 Nonrheumatic aortic valve disorder, unspecified: Secondary | ICD-10-CM | POA: Diagnosis not present

## 2017-03-22 DIAGNOSIS — Z7901 Long term (current) use of anticoagulants: Secondary | ICD-10-CM

## 2017-03-22 LAB — POCT INR: INR: 2.7

## 2017-05-03 ENCOUNTER — Ambulatory Visit (INDEPENDENT_AMBULATORY_CARE_PROVIDER_SITE_OTHER): Payer: 59 | Admitting: *Deleted

## 2017-05-03 DIAGNOSIS — Z7901 Long term (current) use of anticoagulants: Secondary | ICD-10-CM | POA: Diagnosis not present

## 2017-05-03 DIAGNOSIS — Z952 Presence of prosthetic heart valve: Secondary | ICD-10-CM

## 2017-05-03 DIAGNOSIS — I359 Nonrheumatic aortic valve disorder, unspecified: Secondary | ICD-10-CM

## 2017-05-03 LAB — POCT INR: INR: 2.4

## 2017-05-22 ENCOUNTER — Other Ambulatory Visit: Payer: Self-pay | Admitting: Cardiovascular Disease

## 2017-05-22 NOTE — Telephone Encounter (Signed)
Please review for refill, Thanks !  

## 2017-06-14 ENCOUNTER — Ambulatory Visit (INDEPENDENT_AMBULATORY_CARE_PROVIDER_SITE_OTHER): Payer: BLUE CROSS/BLUE SHIELD | Admitting: *Deleted

## 2017-06-14 DIAGNOSIS — I359 Nonrheumatic aortic valve disorder, unspecified: Secondary | ICD-10-CM

## 2017-06-14 DIAGNOSIS — Z7901 Long term (current) use of anticoagulants: Secondary | ICD-10-CM | POA: Diagnosis not present

## 2017-06-14 DIAGNOSIS — Z952 Presence of prosthetic heart valve: Secondary | ICD-10-CM | POA: Diagnosis not present

## 2017-06-14 LAB — POCT INR: INR: 2.3

## 2017-07-26 ENCOUNTER — Ambulatory Visit (INDEPENDENT_AMBULATORY_CARE_PROVIDER_SITE_OTHER): Payer: BLUE CROSS/BLUE SHIELD

## 2017-07-26 DIAGNOSIS — I359 Nonrheumatic aortic valve disorder, unspecified: Secondary | ICD-10-CM

## 2017-07-26 DIAGNOSIS — Z7901 Long term (current) use of anticoagulants: Secondary | ICD-10-CM

## 2017-07-26 DIAGNOSIS — Z952 Presence of prosthetic heart valve: Secondary | ICD-10-CM | POA: Diagnosis not present

## 2017-07-26 LAB — POCT INR: INR: 2.3

## 2017-07-27 ENCOUNTER — Other Ambulatory Visit: Payer: Self-pay | Admitting: Cardiovascular Disease

## 2017-07-27 NOTE — Telephone Encounter (Signed)
Refill Request.  

## 2017-08-19 ENCOUNTER — Other Ambulatory Visit: Payer: Self-pay | Admitting: Cardiovascular Disease

## 2017-08-25 ENCOUNTER — Other Ambulatory Visit: Payer: Self-pay

## 2017-08-25 MED ORDER — WARFARIN SODIUM 7.5 MG PO TABS: ORAL_TABLET | ORAL | 3 refills | Status: DC

## 2017-09-06 ENCOUNTER — Ambulatory Visit (INDEPENDENT_AMBULATORY_CARE_PROVIDER_SITE_OTHER): Payer: Self-pay

## 2017-09-06 DIAGNOSIS — Z952 Presence of prosthetic heart valve: Secondary | ICD-10-CM

## 2017-09-06 DIAGNOSIS — Z7901 Long term (current) use of anticoagulants: Secondary | ICD-10-CM

## 2017-09-06 DIAGNOSIS — I359 Nonrheumatic aortic valve disorder, unspecified: Secondary | ICD-10-CM

## 2017-09-06 LAB — POCT INR: INR: 2.2

## 2017-10-24 ENCOUNTER — Encounter: Payer: Self-pay | Admitting: Cardiovascular Disease

## 2017-10-24 ENCOUNTER — Ambulatory Visit: Payer: Self-pay | Admitting: Cardiovascular Disease

## 2017-10-24 VITALS — BP 136/70 | HR 69 | Ht 69.0 in | Wt 188.5 lb

## 2017-10-24 DIAGNOSIS — E785 Hyperlipidemia, unspecified: Secondary | ICD-10-CM

## 2017-10-24 DIAGNOSIS — Z720 Tobacco use: Secondary | ICD-10-CM

## 2017-10-24 DIAGNOSIS — I359 Nonrheumatic aortic valve disorder, unspecified: Secondary | ICD-10-CM

## 2017-10-24 DIAGNOSIS — I10 Essential (primary) hypertension: Secondary | ICD-10-CM

## 2017-10-24 NOTE — Progress Notes (Signed)
Cardiology Office Note   Date:  10/24/2017   ID:  Trevor JewRoyce D Clayborn, DOB 09/18/1956, MRN [id number]  PCP:  Tamsen Roershrismon, Dennis E, PA  Cardiologist:   Lorine BearsMuhammad Chauntelle Azpeitia, MD   Chief Complaint  Patient presents with  . OTHER    12 month f/u no complaints today. Meds reviewed verbally with pt.      History of Present Illness: Trevor Santiago is a 61 y.o. male who presents for a follow-up visit regarding aortic valve replacement with mechanical aortic valve. He presented in 2009 with symptomatic critical aortic stenosis due to bicuspid aortic valve. He underwent aortic valve replacement with an investigational mechanical aortic valve at [facility] without complications. Cardiac catheterization before surgery showed no significant coronary artery disease with mildly dilated ascending aorta. Maintaining therapeutic INR was extremely difficult and required a very large dose of warfarin. He was evaluated by hematology and ultimately he achieved therapeutic INR on 17.5 mg of warfarin daily  He retired last year and has been doing extremely well since then.  His dizziness resolved.  No chest pain or shortness of breath.  He cut down on smoking cessation and currently smokes 1 or 2 cigarettes a day and trying to quit completely.  He was started on simvastatin by his primary care physician for hyperlipidemia But he stopped taking the medication as it made him feel bad.  Past Medical History:  Diagnosis Date  . Back pain   . Bicuspid aortic valve    With severe stenosis diagnosed in 2009. Status post [facility]  . Leaky heart valve     Past Surgical History:  Procedure Laterality Date  . AORTIC VALVE REPLACEMENT    . EXTERNAL EAR SURGERY       Current Outpatient Medications  Medication Sig Dispense Refill  . celecoxib (CELEBREX) 200 MG capsule Take 1 capsule (200 mg total) by mouth 2 (two) times daily. 30 capsule 0  . metoprolol succinate (TOPROL-XL) 25 MG 24 hr tablet  TAKE ONE TABLET BY MOUTH ONCE DAILY 90 tablet 0  . simvastatin (ZOCOR) 20 MG tablet Take 1 tablet (20 mg total) by mouth at bedtime. 30 tablet 3  . warfarin (COUMADIN) 10 MG tablet TAKE AS DIRECTED BY  COUMADIN  CLINIC 30 tablet 3  . warfarin (COUMADIN) 7.5 MG tablet TAKE AS DIRECTED BY COUMADIN CLINIC 40 tablet 3   No current facility-administered medications for this visit.     Allergies:   Penicillins and Tetracyclines & related    Social History:  The patient  reports that he has been smoking cigarettes.  He has smoked for the past 0.00 years. he has never used smokeless tobacco. He reports that he drinks alcohol. He reports that he does not use drugs.   Family History:  The patient's family history is not on file.    ROS:  Please see the history of present illness.   Otherwise, review of systems are positive for none.   All other systems are reviewed and negative.    PHYSICAL EXAM: VS:  BP 136/70 (BP Location: Left Arm, Patient Position: Sitting, Cuff Size: Normal)   Pulse 69   Ht 5\' 9"  (1.753 m)   Wt 188 lb 8 oz (85.5 kg)   BMI 27.84 kg/m  , BMI Body mass index is 27.84 kg/m. GEN: Well nourished, well developed, in no acute distress  HEENT: normal  Neck: no JVD, carotid bruits, or masses Cardiac: RRR with normal mechanical heart sounds;  no  rubs, or gallops,no edema .  2/6 systolic ejection murmur in the aortic area Respiratory:  clear to auscultation bilaterally, normal work of breathing GI: soft, nontender, nondistended, + BS MS: no deformity or atrophy  Skin: warm and dry, no rash Neuro:  Strength and sensation are intact Psych: euthymic mood, full affect   EKG:  EKG is ordered today. The ekg ordered today demonstrates normal sinus rhythm with no significant ST or T wave changes.   Recent Labs: No results found for requested labs within last 8760 hours.    Lipid Panel    Component Value Date/Time   CHOL 228 (H) 10/14/2016 1223   TRIG 82 10/14/2016 1223    HDL 75 10/14/2016 1223   CHOLHDL 3.0 10/14/2016 1223   LDLCALC 137 (H) 10/14/2016 1223      Wt Readings from Last 3 Encounters:  10/24/17 188 lb 8 oz (85.5 kg)  03/20/17 182 lb 3.2 oz (82.6 kg)  02/08/17 180 lb (81.6 kg)       No flowsheet data found.    ASSESSMENT AND PLAN:  1.  Status post aortic valve replacement with mechanical valve: Currently with no symptoms suggestive of angina or heart failure.  Echocardiogram last year showed normal functioning mechanical aortic valve with normal ejection fraction.  Continue long-term anticoagulation with a target INR between 2 and 3.  2. Tobacco use: He cut down significantly but did not quit.  I discussed with him the importance of complete smoking cessation.    3.  Essential hypertension: Blood pressure is controlled on Toprol.  4.  Hyperlipidemia: His LDL was 137.  He was started on simvastatin.  However, he reports intolerance of the medication.  I discussed with him the importance of healthy diet and exercise.  If LDL remains elevated, I recommend either atorvastatin or rosuvastatin.   Disposition:   FU with me in 1 year.   Signed,  Lorine BearsMuhammad Lynessa Almanzar, MD  10/24/2017 2:55 PM    [facility]

## 2017-10-24 NOTE — Patient Instructions (Signed)
Medication Instructions: Continue same medications.   Labwork: None.   Procedures/Testing: None.   Follow-Up: 1 year with Dr. Bazil Dhanani.   Any Additional Special Instructions Will Be Listed Below (If Applicable).     If you need a refill on your cardiac medications before your next appointment, please call your pharmacy.   

## 2017-11-01 ENCOUNTER — Ambulatory Visit (INDEPENDENT_AMBULATORY_CARE_PROVIDER_SITE_OTHER): Payer: Self-pay

## 2017-11-01 DIAGNOSIS — I359 Nonrheumatic aortic valve disorder, unspecified: Secondary | ICD-10-CM

## 2017-11-01 DIAGNOSIS — Z7901 Long term (current) use of anticoagulants: Secondary | ICD-10-CM

## 2017-11-01 DIAGNOSIS — Z952 Presence of prosthetic heart valve: Secondary | ICD-10-CM

## 2017-11-01 LAB — POCT INR: INR: 2.5

## 2017-11-01 NOTE — Patient Instructions (Signed)
Continue on same dosage 17.5mg  daily except 7.5mg  on Mondays and Fridays.   Recheck in 6 weeks.

## 2017-12-02 ENCOUNTER — Other Ambulatory Visit: Payer: Self-pay | Admitting: Cardiovascular Disease

## 2017-12-04 NOTE — Telephone Encounter (Signed)
Please review for refill. Thanks!  

## 2018-02-18 ENCOUNTER — Other Ambulatory Visit: Payer: Self-pay | Admitting: Cardiovascular Disease

## 2018-02-19 ENCOUNTER — Ambulatory Visit (INDEPENDENT_AMBULATORY_CARE_PROVIDER_SITE_OTHER): Payer: Self-pay

## 2018-02-19 DIAGNOSIS — Z952 Presence of prosthetic heart valve: Secondary | ICD-10-CM

## 2018-02-19 DIAGNOSIS — Z7901 Long term (current) use of anticoagulants: Secondary | ICD-10-CM

## 2018-02-19 DIAGNOSIS — I359 Nonrheumatic aortic valve disorder, unspecified: Secondary | ICD-10-CM

## 2018-02-19 LAB — POCT INR: INR: 2.3

## 2018-02-19 NOTE — Patient Instructions (Signed)
Continue on same dosage 17.5mg daily except 7.5mg on Mondays and Fridays.   Recheck in 6 weeks.  

## 2018-05-21 ENCOUNTER — Ambulatory Visit (INDEPENDENT_AMBULATORY_CARE_PROVIDER_SITE_OTHER): Payer: Self-pay

## 2018-05-21 DIAGNOSIS — Z952 Presence of prosthetic heart valve: Secondary | ICD-10-CM

## 2018-05-21 DIAGNOSIS — Z7901 Long term (current) use of anticoagulants: Secondary | ICD-10-CM

## 2018-05-21 DIAGNOSIS — I359 Nonrheumatic aortic valve disorder, unspecified: Secondary | ICD-10-CM

## 2018-05-21 LAB — POCT INR: INR: 3 (ref 2.0–3.0)

## 2018-05-21 NOTE — Patient Instructions (Signed)
Have a large serving of greens today and continue on same dosage 17.5mg  daily except 7.5mg  on Mondays and Fridays.   Recheck in 6 weeks.

## 2018-05-30 ENCOUNTER — Other Ambulatory Visit: Payer: Self-pay | Admitting: Cardiovascular Disease

## 2018-05-30 NOTE — Telephone Encounter (Signed)
Please review for refill. Thanks!  

## 2018-08-22 ENCOUNTER — Ambulatory Visit (INDEPENDENT_AMBULATORY_CARE_PROVIDER_SITE_OTHER): Payer: Self-pay

## 2018-08-22 DIAGNOSIS — I359 Nonrheumatic aortic valve disorder, unspecified: Secondary | ICD-10-CM

## 2018-08-22 DIAGNOSIS — Z952 Presence of prosthetic heart valve: Secondary | ICD-10-CM

## 2018-08-22 DIAGNOSIS — Z7901 Long term (current) use of anticoagulants: Secondary | ICD-10-CM

## 2018-08-22 LAB — POCT INR: INR: 2.5 (ref 2.0–3.0)

## 2018-08-22 NOTE — Patient Instructions (Signed)
Please continue on same dosage 17.5mg  daily except 7.5mg  on Mondays and Fridays.   Recheck in 6 weeks.

## 2018-08-25 ENCOUNTER — Other Ambulatory Visit: Payer: Self-pay | Admitting: Cardiovascular Disease

## 2018-08-27 NOTE — Telephone Encounter (Signed)
Refill Request.  

## 2018-09-28 ENCOUNTER — Other Ambulatory Visit: Payer: Self-pay | Admitting: Cardiovascular Disease

## 2018-09-28 NOTE — Telephone Encounter (Signed)
Refill Request.  

## 2018-10-31 ENCOUNTER — Ambulatory Visit (INDEPENDENT_AMBULATORY_CARE_PROVIDER_SITE_OTHER): Payer: Self-pay

## 2018-10-31 ENCOUNTER — Encounter: Payer: Self-pay | Admitting: Nurse Practitioner

## 2018-10-31 ENCOUNTER — Ambulatory Visit (INDEPENDENT_AMBULATORY_CARE_PROVIDER_SITE_OTHER): Payer: Self-pay | Admitting: Nurse Practitioner

## 2018-10-31 VITALS — BP 160/74 | HR 66 | Ht 69.0 in | Wt 187.5 lb

## 2018-10-31 DIAGNOSIS — Z952 Presence of prosthetic heart valve: Secondary | ICD-10-CM

## 2018-10-31 DIAGNOSIS — E782 Mixed hyperlipidemia: Secondary | ICD-10-CM

## 2018-10-31 DIAGNOSIS — Z7901 Long term (current) use of anticoagulants: Secondary | ICD-10-CM

## 2018-10-31 DIAGNOSIS — I10 Essential (primary) hypertension: Secondary | ICD-10-CM

## 2018-10-31 DIAGNOSIS — Z720 Tobacco use: Secondary | ICD-10-CM

## 2018-10-31 DIAGNOSIS — I359 Nonrheumatic aortic valve disorder, unspecified: Secondary | ICD-10-CM

## 2018-10-31 LAB — POCT INR: INR: 1.4 — AB (ref 2.0–3.0)

## 2018-10-31 MED ORDER — METOPROLOL SUCCINATE ER 25 MG PO TB24: 25.0000 mg | ORAL_TABLET | Freq: Every day | ORAL | 3 refills | Status: DC

## 2018-10-31 NOTE — Progress Notes (Signed)
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  Past Medical History    Past Medical History:  Diagnosis Date  . Aortic insufficiency    a. 09/2016 Echo: mild to mod AI (mech valve prosthesis).  . Back pain   . Bicuspid aortic valve w/ severe Ao Stenosis    a. 2009 s/p mechanical aortic valve replacement at [facility]; b. 09/2016 Echo: EF 60-65%, no rwma, mild to mod AI, mild MR, nl RV fxn.  . Essential hypertension   . Hyperlipidemia   . Tobacco abuse    Past Surgical History:  Procedure Laterality Date  . AORTIC VALVE REPLACEMENT    . EXTERNAL EAR SURGERY      Allergies  Allergies  Allergen Reactions  . Penicillins   . Simvastatin     Myalgias  . Tetracyclines & Related     History of Present Illness    62 year old male with the above past medical history including bicuspid aortic valve and development of critical aortic stenosis status post mechanical aortic valve replacement at [facility] in 2009.  Per notes, this was an investigational device at the time.  His other history includes hypertension, hyperlipidemia, and development of mild to moderate aortic insufficiency postoperatively.  He also smokes cigarettes on occasion.  He was last seen in clinic in November 2018.  Since then, he reports doing well.  He is reasonably active but does not routinely exercise.  He is retired and denies chest pain, dyspnea, palpitations, PND, orthopnea, dizziness, syncope, edema, or early satiety.  He says that in the setting of normal blood pressures in the past, he came off of his beta-blocker within the past 6 to  12 months.  He does not have a blood pressure cuff at home and does not routinely know what his blood pressure runs.  He is 160/74 today.  He also is come off of simvastatin therapy because it was causing him myalgias.  He has not had lipids in about 2 years.  Home Medications    Prior to Admission medications   Medication Sig Start Date End Date Taking? Authorizing Provider  metoprolol succinate (TOPROL-XL) 25 MG 24 hr tablet TAKE 1 TABLET BY MOUTH ONCE DAILY Patient taking differently: 25 mg as needed.  02/19/18  Yes Iran Ouch, MD  warfarin (COUMADIN) 10 MG tablet AS DIRECTED BY  COUMADIN  CLINIC 08/27/18  Yes Iran Ouch, MD  warfarin (COUMADIN) 7.5 MG tablet AS DIRECTED BY  [facility]MADIN  CLINIC 09/28/18  Yes Iran Ouch, MD    Review of Systems    He denies chest pain, palpitations, dyspnea, pnd, orthopnea, n, v, dizziness, syncope, edema, weight gain, or early satiety.  All other systems reviewed and are otherwise negative except as noted above.  Physical Exam    VS:  BP (!) 160/74 (BP Location: Left Arm, Patient Position: Sitting, Cuff Size: Normal)   Pulse 66   Ht 5\' 9"  (1.753 m)   Wt 187 lb 8 oz (85 kg)   BMI 27.69 kg/m  , BMI Body mass index is 27.69 kg/m. GEN: Well nourished, well developed, in no acute distress.  HEENT: normal. Neck: Supple, no JVD, carotid bruits, or masses. Cardiac: RRR, 2/6 systolic ejection murmur at the upper sternal borders with a mechanical S2.  No rubs, or gallops. No clubbing, cyanosis, edema.  Radials/DP/PT 2+ and equal bilaterally.  Respiratory:  Respirations regular and unlabored, clear to auscultation bilaterally. GI: Soft, nontender, nondistended, BS + x 4. MS: no deformity or atrophy. Skin: warm and dry, no rash. Neuro:  Strength and sensation are intact. Psych: Normal affect.  Accessory Clinical Findings    ECG personally reviewed by me today -sinus arrhythmia, 66, baseline artifact- no acute changes.  Assessment & Plan      1.  History of mechanical aortic valve replacement: Status post mechanical AVR at [facility] in 2009.  He has been stable over the past year.  Last echo in 2017 showed normal functioning prosthesis with mild to moderate AI.  2.  Essential hypertension: Blood pressure elevated today at 160/74.  I repeated this and got 160/60.  He says that he came off of his Toprol because his blood pressures are always okay though also says he does not routinely check his blood pressures.  I have asked him to resume Toprol-XL 25 mg daily and as he does not have a cuff, he plans to go to his local pharmacy twice in the next week to assess his blood pressure.  I have asked him to contact us if systolics are greater than 130, at which point we would likely have to add an additional agent pending heart rate.  3.  Hyperlipidemia: Previously on simvastatin but stopped this secondary to myalgias.  His LDL in November 2017 was 137.  I will arrange for follow-up fasting lipids.  If lipids look similar on repeat, his calculated risk of cardiac event in the next 10 years would be 30.8% and therefore, we will need to be more aggressive about lipid lowering.  4.  Tobacco abuse: Still smokes a few cigarettes a week.  Complete cessation advised.  5.  Disposition: Follow-up fasting lipids as outlined above.  Patient will check his blood pressure at his local pharmacy and contact us with numbers.  Otherwise he will follow-up in 1 year.   Nicolasa Duckinghristopher Judyth Demarais, NP 10/31/2018, 5:44 PM

## 2018-10-31 NOTE — Patient Instructions (Signed)
Medication Instructions:  Your physician has recommended you make the following change in your medication:  1. START Toprol XL 25 mg once a day  If you need a refill on your cardiac medications before your next appointment, please call your pharmacy.   Lab work: CMET and fasting lipid panel. Please make sure not to eat or drink anything after midnight prior to coming in for these labs. You may have sip of water with your pills.  If you have labs (blood work) drawn today and your tests are completely normal, you will receive your results only by: Marland Kitchen. MyChart Message (if you have MyChart) OR . A paper copy in the mail If you have any lab test that is abnormal or we need to change your treatment, we will call you to review the results.  Testing/Procedures: None at this time.  Follow-Up: At [facility], you and your health needs are our priority.  As part of our continuing mission to provide you with exceptional heart care, we have created designated Provider Care Teams.  These Care Teams include your primary Cardiologist (physician) and Advanced Practice Providers (APPs -  Physician Assistants and Nurse Practitioners) who all work together to provide you with the care you need, when you need it. You will need a follow up appointment in 12 months.  Please call our office 2 months in advance to schedule this appointment.  You may see Dr. Kirke CorinArida or one of the following Advanced Practice Providers on your designated Care Team:   Nicolasa Duckinghristopher Berge, NP Eula Listenyan Dunn, PA-C . Marisue IvanJacquelyn Visser, PA-C  Any Other Special Instructions Will Be Listed Below (If Applicable). Please call us with your blood pressure readings.

## 2018-10-31 NOTE — Patient Instructions (Signed)
Please take 20 mg today & tomorrow, take 17.5 mg on Friday, then resume same dosage 17.5mg  daily except 7.5mg  on Mondays and Fridays.   Recheck in 3 & 1/2 weeks.

## 2019-03-22 ENCOUNTER — Other Ambulatory Visit: Payer: Self-pay | Admitting: Cardiovascular Disease

## 2019-03-22 NOTE — Telephone Encounter (Signed)
Pt is overdue for follow-up and has not had his INR checked since 10/31/18.  Attempted to call pt and schedule appt.  Had to LM on VM TCB Monday for appt.  Advised we were unable to refill his Warfarin rx until pt has a follow-up appt.  Will forward message to [place], RN in [place] office and await call back to schedule follow-up.

## 2019-03-22 NOTE — Telephone Encounter (Signed)
Refill Request.  

## 2019-03-25 ENCOUNTER — Telehealth: Payer: Self-pay | Admitting: Pharmacist

## 2019-03-25 NOTE — Telephone Encounter (Signed)

## 2019-03-27 ENCOUNTER — Other Ambulatory Visit: Payer: Self-pay

## 2019-03-27 ENCOUNTER — Ambulatory Visit (INDEPENDENT_AMBULATORY_CARE_PROVIDER_SITE_OTHER): Payer: Self-pay

## 2019-03-27 DIAGNOSIS — Z7901 Long term (current) use of anticoagulants: Secondary | ICD-10-CM

## 2019-03-27 DIAGNOSIS — I359 Nonrheumatic aortic valve disorder, unspecified: Secondary | ICD-10-CM

## 2019-03-27 DIAGNOSIS — Z952 Presence of prosthetic heart valve: Secondary | ICD-10-CM

## 2019-03-27 LAB — POCT INR: INR: 2 (ref 2.0–3.0)

## 2019-03-27 MED ORDER — WARFARIN SODIUM 10 MG PO TABS: 10.0000 mg | ORAL_TABLET | ORAL | 0 refills | Status: DC

## 2019-03-27 NOTE — Patient Instructions (Signed)
Please continue same dosage 17.5mg daily except 7.5mg on Mondays and Fridays.   Recheck in 6 weeks. 

## 2019-03-28 ENCOUNTER — Other Ambulatory Visit: Payer: Self-pay | Admitting: Cardiovascular Disease

## 2019-03-28 NOTE — Telephone Encounter (Signed)
Please review for refill.  

## 2019-04-29 ENCOUNTER — Other Ambulatory Visit: Payer: Self-pay | Admitting: Cardiovascular Disease

## 2019-04-29 NOTE — Telephone Encounter (Signed)
Please review for refill. Thank you! 

## 2019-05-06 ENCOUNTER — Telehealth: Payer: Self-pay

## 2019-05-06 NOTE — Telephone Encounter (Signed)

## 2019-05-08 ENCOUNTER — Ambulatory Visit (INDEPENDENT_AMBULATORY_CARE_PROVIDER_SITE_OTHER): Payer: Self-pay

## 2019-05-08 ENCOUNTER — Other Ambulatory Visit: Payer: Self-pay

## 2019-05-08 DIAGNOSIS — I359 Nonrheumatic aortic valve disorder, unspecified: Secondary | ICD-10-CM

## 2019-05-08 DIAGNOSIS — Z952 Presence of prosthetic heart valve: Secondary | ICD-10-CM

## 2019-05-08 DIAGNOSIS — Z7901 Long term (current) use of anticoagulants: Secondary | ICD-10-CM

## 2019-05-08 LAB — POCT INR: INR: 2.1 (ref 2.0–3.0)

## 2019-05-08 NOTE — Patient Instructions (Signed)
Please continue same dosage 17.5mg daily except 7.5mg on Mondays and Fridays.   Recheck in 6 weeks. 

## 2019-06-19 ENCOUNTER — Other Ambulatory Visit: Payer: Self-pay | Admitting: Cardiovascular Disease

## 2019-06-19 NOTE — Telephone Encounter (Signed)
Refill Request.  

## 2019-06-25 ENCOUNTER — Other Ambulatory Visit: Payer: Self-pay | Admitting: Cardiovascular Disease

## 2019-06-25 NOTE — Telephone Encounter (Signed)
Refill Request.  

## 2019-06-27 ENCOUNTER — Telehealth: Payer: Self-pay | Admitting: *Deleted

## 2019-06-27 NOTE — Telephone Encounter (Signed)
Received a refill for the pt's Warfarin 10mg  tablet and noted he is overdue for an appt. Called pt and he stated he had phone issues and forgot about it. Made pt an appt and he stated he has enough pills to take care of his dose until appt.

## 2019-07-03 ENCOUNTER — Other Ambulatory Visit: Payer: Self-pay

## 2019-07-03 ENCOUNTER — Ambulatory Visit (INDEPENDENT_AMBULATORY_CARE_PROVIDER_SITE_OTHER): Payer: Self-pay

## 2019-07-03 DIAGNOSIS — Z952 Presence of prosthetic heart valve: Secondary | ICD-10-CM

## 2019-07-03 DIAGNOSIS — I359 Nonrheumatic aortic valve disorder, unspecified: Secondary | ICD-10-CM

## 2019-07-03 DIAGNOSIS — Z7901 Long term (current) use of anticoagulants: Secondary | ICD-10-CM

## 2019-07-03 LAB — POCT INR: INR: 3.2 — AB (ref 2.0–3.0)

## 2019-07-03 MED ORDER — WARFARIN SODIUM 10 MG PO TABS: ORAL_TABLET | ORAL | 1 refills | Status: DC

## 2019-07-03 MED ORDER — WARFARIN SODIUM 7.5 MG PO TABS: ORAL_TABLET | ORAL | 1 refills | Status: DC

## 2019-07-03 NOTE — Patient Instructions (Signed)
Please have a large serving of greens today, and continue same dosage 17.5mg  daily except 7.5mg  on Mondays and Fridays.   Recheck in 6 weeks.

## 2019-08-14 ENCOUNTER — Ambulatory Visit (INDEPENDENT_AMBULATORY_CARE_PROVIDER_SITE_OTHER): Payer: Self-pay

## 2019-08-14 ENCOUNTER — Other Ambulatory Visit: Payer: Self-pay

## 2019-08-14 DIAGNOSIS — I359 Nonrheumatic aortic valve disorder, unspecified: Secondary | ICD-10-CM

## 2019-08-14 DIAGNOSIS — Z7901 Long term (current) use of anticoagulants: Secondary | ICD-10-CM

## 2019-08-14 DIAGNOSIS — Z952 Presence of prosthetic heart valve: Secondary | ICD-10-CM

## 2019-08-14 LAB — POCT INR: INR: 1.9 — AB (ref 2.0–3.0)

## 2019-08-14 NOTE — Patient Instructions (Addendum)
Please take extra 1/2 of the 7.5 mg tablet today, then continue same dosage 17.5mg  daily except 7.5mg  on Mondays and Fridays.   Recheck in 6 weeks.

## 2019-09-07 ENCOUNTER — Other Ambulatory Visit: Payer: Self-pay | Admitting: Cardiovascular Disease

## 2019-09-09 NOTE — Telephone Encounter (Signed)
Refill Request.  

## 2019-10-07 ENCOUNTER — Ambulatory Visit (INDEPENDENT_AMBULATORY_CARE_PROVIDER_SITE_OTHER): Payer: Self-pay

## 2019-10-07 ENCOUNTER — Other Ambulatory Visit: Payer: Self-pay

## 2019-10-07 DIAGNOSIS — Z7901 Long term (current) use of anticoagulants: Secondary | ICD-10-CM

## 2019-10-07 DIAGNOSIS — Z952 Presence of prosthetic heart valve: Secondary | ICD-10-CM

## 2019-10-07 DIAGNOSIS — I359 Nonrheumatic aortic valve disorder, unspecified: Secondary | ICD-10-CM

## 2019-10-07 LAB — POCT INR: INR: 3.8 — AB (ref 2.0–3.0)

## 2019-10-07 NOTE — Patient Instructions (Signed)
Please skip warfarin tonight, then continue same dosage 17.5mg  daily except 7.5mg  on Mondays and Fridays.   Recheck in 6 weeks.

## 2019-10-25 ENCOUNTER — Other Ambulatory Visit: Payer: Self-pay | Admitting: Cardiovascular Disease

## 2019-10-28 NOTE — Telephone Encounter (Signed)
Please review for refill. Thanks!  

## 2019-11-18 ENCOUNTER — Ambulatory Visit (INDEPENDENT_AMBULATORY_CARE_PROVIDER_SITE_OTHER): Payer: Self-pay

## 2019-11-18 ENCOUNTER — Ambulatory Visit (INDEPENDENT_AMBULATORY_CARE_PROVIDER_SITE_OTHER): Payer: Self-pay | Admitting: Family

## 2019-11-18 ENCOUNTER — Other Ambulatory Visit: Payer: Self-pay

## 2019-11-18 ENCOUNTER — Encounter: Payer: Self-pay | Admitting: Family

## 2019-11-18 VITALS — BP 150/70 | HR 73 | Ht 68.0 in | Wt 188.0 lb

## 2019-11-18 DIAGNOSIS — Z79899 Other long term (current) drug therapy: Secondary | ICD-10-CM

## 2019-11-18 DIAGNOSIS — I359 Nonrheumatic aortic valve disorder, unspecified: Secondary | ICD-10-CM

## 2019-11-18 DIAGNOSIS — E782 Mixed hyperlipidemia: Secondary | ICD-10-CM

## 2019-11-18 DIAGNOSIS — Z952 Presence of prosthetic heart valve: Secondary | ICD-10-CM

## 2019-11-18 DIAGNOSIS — Z720 Tobacco use: Secondary | ICD-10-CM

## 2019-11-18 DIAGNOSIS — I10 Essential (primary) hypertension: Secondary | ICD-10-CM

## 2019-11-18 DIAGNOSIS — Z7901 Long term (current) use of anticoagulants: Secondary | ICD-10-CM

## 2019-11-18 LAB — POCT INR: INR: 2.4 (ref 2.0–3.0)

## 2019-11-18 MED ORDER — METOPROLOL SUCCINATE ER 25 MG PO TB24: 25.0000 mg | ORAL_TABLET | Freq: Every day | ORAL | 3 refills | Status: DC

## 2019-11-18 MED ORDER — LOSARTAN POTASSIUM 25 MG PO TABS: 25.0000 mg | ORAL_TABLET | Freq: Every day | ORAL | 1 refills | Status: DC

## 2019-11-18 NOTE — Progress Notes (Signed)
Office Visit    Patient Name: Trevor Santiago Date of Encounter: 11/18/2019  Primary Care Provider:  Margo Common, PA Primary Cardiologist:  Kathlyn Sacramento, MD Electrophysiologist:  None   Chief Complaint    Trevor Santiago is a 63 y.o. male with a hx of severe duct stenosis s/p mechanical AVR at [facility], HTN, HLD, tobacco abus presents today for annual follow up of aortic valve disease.   Past Medical History    Past Medical History:  Diagnosis Date  . Aortic insufficiency    a. 09/2016 Echo: mild to mod AI (mech valve prosthesis).  . Back pain   . Bicuspid aortic valve w/ severe Ao Stenosis    a. 2009 s/p mechanical aortic valve replacement at [facility]; b. 09/2016 Echo: EF 60-65%, no rwma, mild to mod AI, mild MR, nl RV fxn.  . Essential hypertension   . Hyperlipidemia   . Tobacco abuse    Past Surgical History:  Procedure Laterality Date  . AORTIC VALVE REPLACEMENT    . EXTERNAL EAR SURGERY      Allergies  Allergies  Allergen Reactions  . Penicillins   . Simvastatin     Myalgias  . Tetracyclines & Related     History of Present Illness    Trevor Santiago is a 63 y.o. male with a hx of severe AV stenosis s/p mechanical aortic valve replacement at [facility] 2009, HTN, HLD, tobacco abuse last seen 10/31/18 by Ignacia Bayley, NP. Presents today for annual follow up of his aortic valve disease.   He reports feeling well.  Denies chest pain, pressure, tightness.  Reports no SOB nor DOE.  He reports no lightheadedness, dizziness, near syncope.  Reports no edema.  He does not check his blood pressure regularly at home nor does he have blood pressure cuff.  We discussed the blood pressure goal of less than 130/80.  Endorses regular exercise by walking.  Tells me he still push mows his lawn and can do this without difficulty.  Endorses eating a heart healthy diet.   Tells me he last saw his primary care provider approximately 2 years ago.  We discussed the importance of  regular healthcare follow-up.  He has not had his lipids checked in 3 years.  He is agreeable to have been checked today.  He was previously trialed on simvastatin but had intolerance of fatigue per his report.  We discussed that lipid therapies have advanced considerably over the last few years and he is agreeable to have his labs drawn today.  EKGs/Labs/Other Studies Reviewed:   The following studies were reviewed today:  Echocardiogram 09/2016 - Left ventricle: The cavity size was at the upper limits of   normal. Systolic function was normal. The estimated ejection   fraction was in the range of 60% to 65%. Wall motion was normal;   there were no regional wall motion abnormalities. Left   ventricular diastolic function parameters were normal. - Aortic valve: A mechanical prosthesis was present. Transvalvular   velocity was increased. There was mild stenosis. There was mild   to moderate regurgitation. Peak velocity (S): 315 cm/s. Mean   gradient (S): 20 mm Hg. - Mitral valve: There was mild regurgitation. - Left atrium: The atrium was at the upper limits of normal in   size. - Right ventricle: Systolic function was normal. - Pulmonary arteries: Systolic pressure was within the normal   range.    EKG:  EKG is  ordered today.  The ekg ordered today demonstrates sinus rhythm rate 73 bpm with no acute ST/T wave changes.  Recent Labs: No results found for requested labs within last 8760 hours.  Recent Lipid Panel    Component Value Date/Time   CHOL 228 (H) 10/14/2016 1223   TRIG 82 10/14/2016 1223   HDL 75 10/14/2016 1223   CHOLHDL 3.0 10/14/2016 1223   LDLCALC 137 (H) 10/14/2016 1223    Home Medications   No outpatient medications have been marked as taking for the 11/18/19 encounter (Appointment) with Alver Sorrow, NP.      Review of Systems      Review of Systems  Constitution: Negative for chills, fever and malaise/fatigue.  Cardiovascular: Negative for chest  pain, dyspnea on exertion, irregular heartbeat, leg swelling, near-syncope, orthopnea, palpitations and syncope.  Respiratory: Negative for cough, shortness of breath and wheezing.   Gastrointestinal: Negative for melena, nausea and vomiting.  Genitourinary: Negative for hematuria.  Neurological: Negative for dizziness, light-headedness and weakness.   All other systems reviewed and are otherwise negative except as noted above.  Physical Exam    VS:  There were no vitals taken for this visit. , BMI There is no height or weight on file to calculate BMI. GEN: Well nourished, well developed, in no acute distress. HEENT: normal. Neck: Supple, no JVD, carotid bruits, or masses. Cardiac: RRR, no murmurs, rubs, or gallops.  Mechanical valve click noted.  No clubbing, cyanosis, edema.  Radials/DP/PT 2+ and equal bilaterally.  Respiratory:  Respirations regular and unlabored, clear to auscultation bilaterally. GI: Soft, nontender, nondistended, BS + x 4. MS: No deformity or atrophy. Skin: Warm and dry, no rash. Neuro:  Strength and sensation are intact. Psych: Normal affect.  Accessory Clinical Findings    ECG personally reviewed by me today -sinus rhythm rate 73 bpm with no acute ST/T wave changes.- no acute changes.  Assessment & Plan    1. Hx of mechanical aortic valve replacement - s/p mechanical AVR at [facility] 2009.  He has been stable over the last year.  Echo 2017 normal functioning prosthesis with mild to moderate AI.  No indication for repeat echocardiogram at this time. 2. Chronic anticoagulation -secondary to mechanical valve.  Reports no bleeding complications.  Continue to follow with Coumadin clinic. 3. HTN -BP elevated today.  We will add losartan 25 mg daily to his present regimen of Toprol 25 mg daily. 4. HLD -last lipid check 3 years ago.  We will check today as part of screening. Lipid panel/CMET today. Noted previous intolerance to simvastatin. 5. Tobacco abuse -he is  gradually decreasing and trying to stop smoking altogether. Smoking cessation encouraged. Recommend utilization of [id number].  Disposition: Follow up in 3 month(s) with Dr. Kirke Corin or APP for follow up of HTN.  Alver Sorrow, NP 11/18/2019, 8:48 AM

## 2019-11-18 NOTE — Patient Instructions (Signed)
Medication Instructions:  - Your physician has recommended you make the following change in your medication:   1) Start cozaar (losartan) 25 mg- take 1 tablet by mouth once daily  *If you need a refill on your cardiac medications before your next appointment, please call your pharmacy*  Lab Work: - Your physician recommends that you have lab work today: [place]  If you have labs (blood work) drawn today and your tests are completely normal, you will receive your results only by: Marland Kitchen MyChart Message (if you have MyChart) OR . A paper copy in the mail If you have any lab test that is abnormal or we need to change your treatment, we will call you to review the results.  Testing/Procedures: - none ordered  Follow-Up: At [facility], you and your health needs are our priority.  As part of our continuing mission to provide you with exceptional heart care, we have created designated Provider Care Teams.  These Care Teams include your primary Cardiologist (physician) and Advanced Practice Providers (APPs -  Physician Assistants and Nurse Practitioners) who all work together to provide you with the care you need, when you need it.  Your next appointment:   3 month(s)  The format for your next appointment:   In Person  Provider:    You may see Kathlyn Sacramento, MD or one of the following Advanced Practice Providers on your designated Care Team:    Murray Hodgkins, NP  Christell Faith, PA-C  Marrianne Mood, PA-C   Other Instructions n/a

## 2019-11-18 NOTE — Patient Instructions (Signed)
Please continue same dosage 17.5mg daily except 7.5mg on Mondays and Fridays.   Recheck in 6 weeks. 

## 2019-11-18 NOTE — Addendum Note (Signed)
Addended by: Alvis Lemmings C on: 11/18/2019 10:26 AM   Modules accepted: Orders

## 2019-11-19 ENCOUNTER — Telehealth: Payer: Self-pay

## 2019-11-19 DIAGNOSIS — E782 Mixed hyperlipidemia: Secondary | ICD-10-CM

## 2019-11-19 DIAGNOSIS — Z952 Presence of prosthetic heart valve: Secondary | ICD-10-CM

## 2019-11-19 DIAGNOSIS — Z79899 Other long term (current) drug therapy: Secondary | ICD-10-CM

## 2019-11-19 LAB — COMPREHENSIVE METABOLIC PANEL
ALT: 16 IU/L (ref 0–44)
AST: 19 IU/L (ref 0–40)
Albumin/Globulin Ratio: 1.5 (ref 1.2–2.2)
Albumin: 4.2 g/dL (ref 3.8–4.8)
Alkaline Phosphatase: 56 IU/L (ref 39–117)
BUN/Creatinine Ratio: 16 (ref 10–24)
BUN: 16 mg/dL (ref 8–27)
Bilirubin Total: 0.3 mg/dL (ref 0.0–1.2)
CO2: 23 mmol/L (ref 20–29)
Calcium: 9.2 mg/dL (ref 8.6–10.2)
Chloride: 104 mmol/L (ref 96–106)
Creatinine, Ser: 0.99 mg/dL (ref 0.76–1.27)
GFR calc Af Amer: 94 mL/min/{1.73_m2} (ref >59)
GFR calc non Af Amer: 81 mL/min/{1.73_m2} (ref >59)
Globulin, Total: 2.8 g/dL (ref 1.5–4.5)
Glucose: 108 mg/dL — ABNORMAL HIGH (ref 65–99)
Potassium: 5.1 mmol/L (ref 3.5–5.2)
Sodium: 139 mmol/L (ref 134–144)
Total Protein: 7 g/dL (ref 6.0–8.5)

## 2019-11-19 LAB — LIPID PANEL
Chol/HDL Ratio: 4.1 ratio (ref 0.0–5.0)
Cholesterol, Total: 243 mg/dL — ABNORMAL HIGH (ref 100–199)
HDL: 60 mg/dL (ref >39)
LDL Chol Calc (NIH): 169 mg/dL — ABNORMAL HIGH (ref 0–99)
Triglycerides: 82 mg/dL (ref 0–149)
VLDL Cholesterol Cal: 14 mg/dL (ref 5–40)

## 2019-11-19 MED ORDER — ROSUVASTATIN CALCIUM 10 MG PO TABS: 10.0000 mg | ORAL_TABLET | Freq: Every day | ORAL | 3 refills | Status: DC

## 2019-11-19 NOTE — Telephone Encounter (Signed)
-----   Message from Loel Dubonnet, NP sent at 11/19/2019  7:55 AM EST ----- Normal kidney, liver, electrolyte. Mildly elevated glucose. Total cholesterol and LDL elevated. Noted previous intolerance to Simvastatin. Please start Crestor 10 mg daily. This cholesterol lowering medication is generally well tolerated but ask him to please call the office to let us know if he has any difficulties. Recheck lipid/liver 6-8 weeks from start date.

## 2019-11-19 NOTE — Telephone Encounter (Signed)
Pt verbalized understanding of his lab results and will start Crestor.. he will call if he has any difficulties.   Pt to call mid- Feb when he decides where he will have his repeat fasting labs drawn.

## 2019-12-27 ENCOUNTER — Other Ambulatory Visit: Payer: Self-pay | Admitting: Cardiovascular Disease

## 2019-12-27 NOTE — Telephone Encounter (Signed)
Refill Request.  

## 2020-01-29 ENCOUNTER — Ambulatory Visit (INDEPENDENT_AMBULATORY_CARE_PROVIDER_SITE_OTHER): Payer: Self-pay

## 2020-01-29 ENCOUNTER — Other Ambulatory Visit: Payer: Self-pay

## 2020-01-29 DIAGNOSIS — Z952 Presence of prosthetic heart valve: Secondary | ICD-10-CM

## 2020-01-29 DIAGNOSIS — I359 Nonrheumatic aortic valve disorder, unspecified: Secondary | ICD-10-CM

## 2020-01-29 DIAGNOSIS — Z7901 Long term (current) use of anticoagulants: Secondary | ICD-10-CM

## 2020-01-29 LAB — POCT INR: INR: 2 (ref 2.0–3.0)

## 2020-01-29 NOTE — Patient Instructions (Signed)
Please continue same dosage 17.5mg  daily except 7.5mg  on Mondays and Fridays.   Recheck in 6 weeks.

## 2020-02-14 ENCOUNTER — Telehealth: Payer: Self-pay | Admitting: Cardiovascular Disease

## 2020-02-14 NOTE — Telephone Encounter (Signed)
Patient calling to see why he needs upcoming appt.   Patient aware per avs meds changed and caitlin recommended 3 m fu   Patient did not take the losartan after filling and says he was frustrated due to difficultly navigating campus to get to appt and bp is now 133/79 yesterday .    Please advise if appt can be postponed or if needed.

## 2020-02-14 NOTE — Telephone Encounter (Signed)
3 month follow up was scheduled for monitoring of blood pressure after starting Losartan and for lipid panel/liver function after starting Crestor.  Appointment may be postponed one month. Or if he prefers, appointment can be virtual with labs (lipid/liver) prior at the [facility].  Either way, please instruct him to check his BP at least 3 times per week and keep a log until his appointment.   Trevor Sorrow, NP

## 2020-02-14 NOTE — Telephone Encounter (Signed)
Optimal goal is for blood pressure less than 130/80. If he is monitoring at home with good results, okay to remain off Losartan.   Cholesterol panel when last seen in December showed LDL or "bad cholesterol" of 169. Our goal is for it to be less than 100 to reduce risk of cardiovascular disease. Would recommend starting Crestor 10mg  daily as previously recommended. It is typically a well tolerated cholesterol medication.   I am happy to print and mail him information about cholesterol and Crestor if he is interested.   Would still recommend follow up in April to follow up of BP and cholesterol. If he prefers virtual visit, that would be fine. Could also schedule for the same day as his next INR (03/11/20) so as to minimize his number of trips to the office. Dr. 03/13/20 is not in the office that date, but he could see APP. If he prefers to see Dr. Kirke Corin - will have to be different date for in-office or virtual.   Kirke Corin, NP

## 2020-02-14 NOTE — Telephone Encounter (Signed)
Spoke with the patient. He has never started Crestor or Losartan as prescribed in Dec 2020.  Patient sts that "I dont need another BP medication, by BP is fine and I do not want it dropping to low." He says he monitors his BP regularly at home with BPs in the 130's/70's.  Advised the patient that I will fwd the update Caitlin and call back with her recommendation regarding appropriate f/u.

## 2020-02-14 NOTE — Telephone Encounter (Signed)
Called to give the patient Trevor Santiago's updated recommendation lmtcb.

## 2020-02-17 NOTE — Telephone Encounter (Signed)
Patient returning call - had to cancel appointment with Dr Kirke Corin 3/23, wants to speak with nurse before rescheduling

## 2020-02-18 ENCOUNTER — Ambulatory Visit: Payer: Self-pay | Admitting: Cardiovascular Disease

## 2020-02-18 MED ORDER — ROSUVASTATIN CALCIUM 10 MG PO TABS: 10.0000 mg | ORAL_TABLET | Freq: Every day | ORAL | 5 refills | Status: DC

## 2020-02-18 NOTE — Telephone Encounter (Signed)
Spoke with the patient. Patient is agreeable with rescheduling his o/v the same day he comes in for his 03/11/20 cvrr appt. Appt scheduled with JV @ 9:30am.   Patient sts that he is agreeable with starting Crestor 10 mg qd and rqst another Rx be sent to his pharmacy.  Advised the patient to continue to monitor his BP regularly with a BP goal of <130/80 and instructed by [facility]. Adv the patient to keep a record of his BP and HR and bring them with him to his upcoming appt.

## 2020-02-19 MED ORDER — ATORVASTATIN CALCIUM 20 MG PO TABS: 20.0000 mg | ORAL_TABLET | Freq: Every day | ORAL | 3 refills | Status: DC

## 2020-02-19 NOTE — Addendum Note (Signed)
Addended by: Jarvis Newcomer on: 02/19/2020 04:44 PM   Modules accepted: Orders

## 2020-02-19 NOTE — Telephone Encounter (Signed)
Patient calling back in after pharmacy called and said Crestor was ready for pickup. Patient states he cannot take Crestor, it makes him very sick. Patient states the cost was also too much for him  Please follow up with an alternative if able

## 2020-02-19 NOTE — Telephone Encounter (Signed)
Patient made aware of Gillian Shields, NP response and recommendation. Patient is agreeable with trying Atorvastatin 20 mg qd. Rx sent to the patients pharmacy. Advised the patient to call the office if any issues picking up the medication. Patient verbalized understanding.

## 2020-02-19 NOTE — Telephone Encounter (Signed)
Discontinue Crestor, please. Will plan to trial Atorvastatin 20mg  if he is agreeable. A 90-day supply at [facility] is $38 and a 30-day supply is $15.   [facility], NP

## 2020-02-19 NOTE — Telephone Encounter (Signed)
Update fwd to Caitlin Walker, NP 

## 2020-03-11 ENCOUNTER — Encounter: Payer: Self-pay | Admitting: Physician Assistant

## 2020-03-11 ENCOUNTER — Ambulatory Visit (INDEPENDENT_AMBULATORY_CARE_PROVIDER_SITE_OTHER): Payer: Self-pay

## 2020-03-11 ENCOUNTER — Other Ambulatory Visit: Payer: Self-pay

## 2020-03-11 ENCOUNTER — Ambulatory Visit (INDEPENDENT_AMBULATORY_CARE_PROVIDER_SITE_OTHER): Payer: Self-pay | Admitting: Physician Assistant

## 2020-03-11 VITALS — BP 150/70 | HR 65 | Ht 69.0 in | Wt 185.5 lb

## 2020-03-11 DIAGNOSIS — E782 Mixed hyperlipidemia: Secondary | ICD-10-CM

## 2020-03-11 DIAGNOSIS — I359 Nonrheumatic aortic valve disorder, unspecified: Secondary | ICD-10-CM

## 2020-03-11 DIAGNOSIS — Z7901 Long term (current) use of anticoagulants: Secondary | ICD-10-CM

## 2020-03-11 DIAGNOSIS — Z720 Tobacco use: Secondary | ICD-10-CM

## 2020-03-11 DIAGNOSIS — I10 Essential (primary) hypertension: Secondary | ICD-10-CM

## 2020-03-11 DIAGNOSIS — Z952 Presence of prosthetic heart valve: Secondary | ICD-10-CM

## 2020-03-11 DIAGNOSIS — Z8774 Personal history of (corrected) congenital malformations of heart and circulatory system: Secondary | ICD-10-CM

## 2020-03-11 LAB — POCT INR: INR: 2.9 (ref 2.0–3.0)

## 2020-03-11 MED ORDER — ATORVASTATIN CALCIUM 20 MG PO TABS: 20.0000 mg | ORAL_TABLET | ORAL | 3 refills | Status: DC

## 2020-03-11 MED ORDER — METOPROLOL SUCCINATE ER 25 MG PO TB24: 25.0000 mg | ORAL_TABLET | Freq: Every day | ORAL | 3 refills | Status: DC

## 2020-03-11 NOTE — Patient Instructions (Signed)
Please continue same warfarin dosage 17.5mg  daily except 7.5mg  on Mondays and Fridays.   Recheck in 6 weeks.

## 2020-03-11 NOTE — Progress Notes (Signed)
Office Visit    Patient Name: AMADEUS OYAMA Date of Encounter: 03/11/2020  Primary Care Provider:  Margo Common, PA Primary Cardiologist:  Kathlyn Sacramento, MD  Chief Complaint    Chief Complaint  Patient presents with  . OTHER    3 month f/u BP and cholesterol. Pt d/c atorvastatin due to body aches/leg pain and losartan due to low BP. Meds reviewed verbally with pt.   64 year old male with history of severe AV stenosis s/p mechanical AVR at [facility], hypertension, hyperlipidemia, tobacco abuse, and presenting today for follow-up.  Past Medical History    Past Medical History:  Diagnosis Date  . Aortic insufficiency    a. 09/2016 Echo: mild to mod AI (mech valve prosthesis).  . Back pain   . Bicuspid aortic valve w/ severe Ao Stenosis    a. 2009 s/p mechanical aortic valve replacement at [facility]; b. 09/2016 Echo: EF 60-65%, no rwma, mild to mod AI, mild MR, nl RV fxn.  . Essential hypertension   . Hyperlipidemia   . Tobacco abuse    Past Surgical History:  Procedure Laterality Date  . AORTIC VALVE REPLACEMENT    . EXTERNAL EAR SURGERY      Allergies  Allergies  Allergen Reactions  . Penicillins   . Simvastatin     Myalgias  . Tetracyclines & Related     History of Present Illness    DEMARIAN EPPS is a 64 y.o. male with PMH as above.  He was last seen 11/18/2019 in the office for annual follow-up of aortic valve disease, as he has a history of severe AV stenosis s/p mechanical aortic valve replacement at [facility] in 2009.  At that time, he reported feeling well.  He was not checking his blood pressure regularly at home and did not have a blood pressure cuff.  He was regularly walking.  He also noted pushing the lawnmower without difficulty.  Heart healthy diet was reported.  He reportedly had not been to see a primary care provider in approximately 2 years with the importance of regular follow-up stressed.  Trial statin was recommended with repeat lipid labs ordered,  given his lipids had not been checked in 3 years.  He returns to clinic today and reports he is doing well from a cardiac standpoint. He denies chest pain, palpitations, dyspnea, pnd, orthopnea, n, v, dizziness, syncope, edema, weight gain, or early satiety.  He does note myalgias with recently started atorvastatin with recommendation for every other day dosing as outlined below.  He has not been taking his losartan 25 mg daily due to concern regarding SBP 118 with clarification that this is not a concerning finding unless he experiences symptoms.  Reviewed that BP 130/80 is the upper limit of normal and he should strive for BP below this if possible.  He will restart his losartan and attributes today's elevated pressures 2/2 not taking this medication.  He has been taking his metoprolol as prescribed.  He continues to follow with the Coumadin clinic for his warfarin.  No signs or symptoms consistent with bleeding. Home Medications    Prior to Admission medications   Medication Sig Start Date End Date Taking? Authorizing Provider  Coenzyme Q10 (CO Q 10 PO) Take by mouth daily.   Yes [provider]  metoprolol succinate (TOPROL-XL) 25 MG 24 hr tablet Take 1 tablet (25 mg total) by mouth daily. 03/11/20  Yes Marrianne Mood D, PA-C  Omega-3 Fatty Acids (FISH OIL PO)  Take by mouth daily.   Yes [provider]  warfarin (COUMADIN) 10 MG tablet TAKE 1 TABLET BY MOUTH ONCE DAILY AS DIRECTED BY COUMADIN CLINIC 12/27/19  Yes Iran Ouch, MD  warfarin (COUMADIN) 7.5 MG tablet USE AS DIRECTED BY COUMADIN CLINIC 07/03/19  Yes Iran Ouch, MD  atorvastatin (LIPITOR) 20 MG tablet Take 1 tablet (20 mg total) by mouth every other day. 03/11/20 06/09/20  Marisue Ivan D, PA-C    Review of Systems    He denies chest pain, palpitations, dyspnea, pnd, orthopnea, edema, n, v, dizziness, syncope, weight gain, or early satiety  All other systems reviewed and are otherwise negative except as  noted above.  Physical Exam    VS:  BP (!) 150/70 (BP Location: Left Arm, Patient Position: Sitting, Cuff Size: Normal)   Pulse 65   Ht 5\' 9"  (1.753 m)   Wt 185 lb 8 oz (84.1 kg)   SpO2 99%   BMI 27.39 kg/m  , BMI Body mass index is 27.39 kg/m. GEN: Well nourished, well developed, in no acute distress. HEENT: normal. Neck: Supple, no JVD, carotid bruits, or masses. Cardiac: RRR,+2/6 systolic murmur and mechanical click. No rubs, or gallops. No clubbing, cyanosis. Minimal bilateral edema.  Radials/DP/PT 2+ and equal bilaterally.  Respiratory:  Respirations regular and unlabored, clear to auscultation bilaterally. GI: Soft, nontender, nondistended, BS + x 4. MS: no deformity or atrophy. Skin: warm and dry, no rash. Neuro:  Strength and sensation are intact. Psych: Normal affect.  Accessory Clinical Findings    ECG personally reviewed by me today - NSR, 65 bpm, poor R wave progression in lead III, baseline artifact,- no acute changes.  VITALS Reviewed today   Temp Readings from Last 3 Encounters:  03/20/17 98.1 F (36.7 C) (Oral)  02/08/17 (!) 100.6 F (38.1 C) (Oral)  01/16/17 98 F (36.7 C) (Oral)   BP Readings from Last 3 Encounters:  03/11/20 (!) 150/70  11/18/19 (!) 150/70  10/31/18 (!) 160/74   Pulse Readings from Last 3 Encounters:  03/11/20 65  11/18/19 73  10/31/18 66    Wt Readings from Last 3 Encounters:  03/11/20 185 lb 8 oz (84.1 kg)  11/18/19 188 lb (85.3 kg)  10/31/18 187 lb 8 oz (85 kg)     LABS  reviewed today   CareEverwhere Labs present and most recent? Yes/No: No  Lab Results  Component Value Date   WBC 6.0 10/14/2016   HGB 13.6 10/14/2016   HCT 41.9 10/14/2016   MCV 90 10/14/2016   PLT 231 10/14/2016   Lab Results  Component Value Date   CREATININE 0.99 11/18/2019   BUN 16 11/18/2019   NA 139 11/18/2019   K 5.1 11/18/2019   CL 104 11/18/2019   CO2 23 11/18/2019   Lab Results  Component Value Date   ALT 16 11/18/2019   AST  19 11/18/2019   ALKPHOS 56 11/18/2019   BILITOT 0.3 11/18/2019   Lab Results  Component Value Date   CHOL 243 (H) 11/18/2019   HDL 60 11/18/2019   LDLCALC 169 (H) 11/18/2019   TRIG 82 11/18/2019   CHOLHDL 4.1 11/18/2019    No results found for: HGBA1C Lab Results  Component Value Date   TSH 1.090 10/14/2016     STUDIES/PROCEDURES reviewed today   Echocardiogram 09/2016 - Left ventricle: The cavity size was at the upper limits of normal. Systolic function was normal. The estimated ejection fraction was in the range of 60%  to 65%. Wall motion was normal; there were no regional wall motion abnormalities. Left ventricular diastolic function parameters were normal. - Aortic valve: A mechanical prosthesis was present. Transvalvular velocity was increased. There was mild stenosis. There was mild to moderate regurgitation. Peak velocity (S): 315 cm/s. Mean gradient (S): 20 mm Hg. - Mitral valve: There was mild regurgitation. - Left atrium: The atrium was at the upper limits of normal in size. - Right ventricle: Systolic function was normal. - Pulmonary arteries: Systolic pressure was within the normal range.   Assessment & Plan    History of mechanical aortic valve replacement --S/p mechanical AVR at [facility] in 2009.  2017 echo as above with functioning prosthesis and mild to moderate AI.  No indication for repeat echo at this time.  He remains on chronic anticoagulation per guidelines and without any signs or symptoms consistent with bleeding.  Continue current Coumadin and scheduled f/u at Coumadin clinic.  Hypertension, poorly controlled -BP elevated today with goal BP <130/80.  Recent addition of losartan 25 mg daily , which he has held due to concern that SBP of 118 at home was too low. Encouraged SBP 118s or lower if tolerated. Will restart losartan 25mg . Continue Toprol 25 mg daily. If BP still elevated at RTC and with restart of Losartan, consider increased  dose.   Hyperlipidemia, LDL goal <70 -Previously reported intolerance to simvastatin.  Started on atorvastatin 20 mg daily at previous visit with associated myalgias reported today. Agreeable to trial atorvastatin 20mg  every other day to see if this alleviates his symptoms.  Discussed start of Zetia 10 mg daily with patient preference to defer for now- reassess at RTC and pending repeat lipid and liver function in 6 to 8 weeks. Goal LDL <70.  Tobacco use, cessation in progress  -He continues to try to cut back with recommendation for complete cessation and patient understanding.  Medication changes: Restart Losartan. Trial Lipitor 20mg  every other day. Labs ordered: Lipids in 6-8 weeks Studies / Imaging ordered: None Future considerations: Addition of Zetia 10mg . Increased dose of Losartan. Disposition: RTC 6 months or sooner if needed   , PA-C 03/11/2020

## 2020-03-11 NOTE — Patient Instructions (Signed)
Medication Instructions:  1- Change Lipitor to take 1 tablet every other day *If you need a refill on your cardiac medications before your next appointment, please call your pharmacy*   Lab Work: 1- Your physician recommends that you return for lab work in: 6-8 weeks at the medical mall. You will need to be fasting.  No appt is needed. Hours are M-F 7AM- 6 PM.  If you have labs (blood work) drawn today and your tests are completely normal, you will receive your results only by: Marland Kitchen MyChart Message (if you have MyChart) OR . A paper copy in the mail If you have any lab test that is abnormal or we need to change your treatment, we will call you to review the results.   Testing/Procedures: None ordered    Follow-Up: At [facility], you and your health needs are our priority.  As part of our continuing mission to provide you with exceptional heart care, we have created designated Provider Care Teams.  These Care Teams include your primary Cardiologist (physician) and Advanced Practice Providers (APPs -  Physician Assistants and Nurse Practitioners) who all work together to provide you with the care you need, when you need it.  We recommend signing up for the patient portal called "MyChart".  Sign up information is provided on this After Visit Summary.  MyChart is used to connect with patients for Virtual Visits (Telemedicine).  Patients are able to view lab/test results, encounter notes, upcoming appointments, etc.  Non-urgent messages can be sent to your provider as well.   To learn more about what you can do with MyChart, go to [email or web address].    Your next appointment:   6 month(s)  The format for your next appointment:   In Person  Provider:     You may see Lorine Bears, MD or Marisue Ivan, PA-C

## 2020-03-26 ENCOUNTER — Other Ambulatory Visit: Payer: Self-pay | Admitting: Cardiovascular Disease

## 2020-03-26 NOTE — Telephone Encounter (Signed)
Refill request

## 2020-05-20 ENCOUNTER — Ambulatory Visit (INDEPENDENT_AMBULATORY_CARE_PROVIDER_SITE_OTHER): Payer: Self-pay

## 2020-05-20 ENCOUNTER — Other Ambulatory Visit: Payer: Self-pay

## 2020-05-20 DIAGNOSIS — Z7901 Long term (current) use of anticoagulants: Secondary | ICD-10-CM

## 2020-05-20 DIAGNOSIS — I359 Nonrheumatic aortic valve disorder, unspecified: Secondary | ICD-10-CM

## 2020-05-20 DIAGNOSIS — Z952 Presence of prosthetic heart valve: Secondary | ICD-10-CM

## 2020-05-20 LAB — POCT INR: INR: 1.5 — AB (ref 2.0–3.0)

## 2020-05-20 NOTE — Patient Instructions (Signed)
-   Take an extra 10 mg when you get home today,  - continue same warfarin dosage 17.5mg  daily except 7.5mg  on Mondays and Fridays.   - Recheck in 6 weeks.

## 2020-05-27 ENCOUNTER — Other Ambulatory Visit: Payer: Self-pay | Admitting: Cardiovascular Disease

## 2020-05-27 NOTE — Telephone Encounter (Signed)
Please review for refill. Thanks!  

## 2020-06-05 NOTE — Telephone Encounter (Signed)
Refill request for warfarin 7.5mg  tablets.

## 2020-07-13 ENCOUNTER — Telehealth: Payer: Self-pay

## 2020-07-13 NOTE — Telephone Encounter (Signed)
Left message on pt's home # that he missed his INR check this am, to please call back to reschedule ASAP, as he is overdue.

## 2020-07-15 ENCOUNTER — Ambulatory Visit (INDEPENDENT_AMBULATORY_CARE_PROVIDER_SITE_OTHER): Payer: Self-pay

## 2020-07-15 ENCOUNTER — Other Ambulatory Visit: Payer: Self-pay

## 2020-07-15 DIAGNOSIS — Z952 Presence of prosthetic heart valve: Secondary | ICD-10-CM

## 2020-07-15 DIAGNOSIS — I359 Nonrheumatic aortic valve disorder, unspecified: Secondary | ICD-10-CM

## 2020-07-15 DIAGNOSIS — Z7901 Long term (current) use of anticoagulants: Secondary | ICD-10-CM

## 2020-07-15 LAB — POCT INR: INR: 2.4 (ref 2.0–3.0)

## 2020-07-15 NOTE — Patient Instructions (Signed)
-   continue same warfarin dosage 17.5mg daily except 7.5mg on Mondays and Fridays.   °- Recheck in 6 weeks. °

## 2020-09-09 ENCOUNTER — Other Ambulatory Visit: Payer: Self-pay

## 2020-09-09 ENCOUNTER — Ambulatory Visit (INDEPENDENT_AMBULATORY_CARE_PROVIDER_SITE_OTHER): Payer: Self-pay

## 2020-09-09 DIAGNOSIS — Z7901 Long term (current) use of anticoagulants: Secondary | ICD-10-CM

## 2020-09-09 DIAGNOSIS — Z952 Presence of prosthetic heart valve: Secondary | ICD-10-CM

## 2020-09-09 DIAGNOSIS — I359 Nonrheumatic aortic valve disorder, unspecified: Secondary | ICD-10-CM

## 2020-09-09 LAB — POCT INR: INR: 1.8 — AB (ref 2.0–3.0)

## 2020-09-09 NOTE — Patient Instructions (Signed)
-   take 20 mg warfarin tonight & tomorrow, then  - continue same warfarin dosage 17.5mg  daily except 7.5mg  on Mondays and Fridays.   - Recheck in 6 weeks.

## 2020-09-10 ENCOUNTER — Encounter: Payer: Self-pay | Admitting: Cardiovascular Disease

## 2020-09-10 ENCOUNTER — Ambulatory Visit (INDEPENDENT_AMBULATORY_CARE_PROVIDER_SITE_OTHER): Payer: Self-pay | Admitting: Cardiovascular Disease

## 2020-09-10 VITALS — BP 150/60 | HR 61 | Ht 68.0 in | Wt 183.0 lb

## 2020-09-10 DIAGNOSIS — I10 Essential (primary) hypertension: Secondary | ICD-10-CM

## 2020-09-10 DIAGNOSIS — E782 Mixed hyperlipidemia: Secondary | ICD-10-CM

## 2020-09-10 DIAGNOSIS — I359 Nonrheumatic aortic valve disorder, unspecified: Secondary | ICD-10-CM

## 2020-09-10 DIAGNOSIS — Z720 Tobacco use: Secondary | ICD-10-CM

## 2020-09-10 NOTE — Progress Notes (Signed)
Cardiology Office Note   Date:  09/10/2020   ID:  Trevor Santiago, DOB [birth year], MRN [id number]  PCP:  Tamsen Roers, PA  Cardiologist:   Lorine Bears, MD   Chief Complaint  Patient presents with  . other    6 month follow up. Meds reviewed by the pt. verbally. "doing well."       History of Present Illness: Trevor Santiago is a 64 y.o. male who presents for a follow-up visit regarding aortic valve replacement with mechanical aortic valve. He presented in 2009 with symptomatic critical aortic stenosis due to bicuspid aortic valve. He underwent aortic valve replacement with an investigational mechanical aortic valve at [facility] without complications. Cardiac catheterization before surgery showed no significant coronary artery disease with mildly dilated ascending aorta. Maintaining therapeutic INR was extremely difficult and required a very large dose of warfarin. He was evaluated by hematology and ultimately he achieved therapeutic INR on 17.5 mg of warfarin daily  He retired in 2018 to help his wife at home who has been sick.  The patient lost his health insurance as a result.  He is trying to quit smoking.  He denies any chest pain or shortness of breath.  He admits that he has not been taking atorvastatin but does take his antihypertensive medication and warfarin.   Past Medical History:  Diagnosis Date  . Aortic insufficiency    a. 09/2016 Echo: mild to mod AI (mech valve prosthesis).  . Back pain   . Bicuspid aortic valve w/ severe Ao Stenosis    a. 2009 s/p mechanical aortic valve replacement at [facility]; b. 09/2016 Echo: EF 60-65%, no rwma, mild to mod AI, mild MR, nl RV fxn.  . Essential hypertension   . Hyperlipidemia   . Tobacco abuse     Past Surgical History:  Procedure Laterality Date  . AORTIC VALVE REPLACEMENT    . EXTERNAL EAR SURGERY       Current Outpatient Medications  Medication Sig Dispense Refill  . atorvastatin (LIPITOR) 20 MG tablet Take 1 tablet  (20 mg total) by mouth every other day. 15 tablet 3  . Coenzyme Q10 (CO Q 10 PO) Take by mouth daily.    Marland Kitchen losartan (COZAAR) 25 MG tablet Take 25 mg by mouth daily.    . metoprolol succinate (TOPROL-XL) 25 MG 24 hr tablet Take 1 tablet (25 mg total) by mouth daily. 90 tablet 3  . Omega-3 Fatty Acids (FISH OIL PO) Take by mouth daily.    Marland Kitchen warfarin (COUMADIN) 10 MG tablet TAKE AS DIRECTED BY  COUMADIN  CLINIC 60 tablet 0  . warfarin (COUMADIN) 7.5 MG tablet TAKE AS DIRECTED BY  COUMADIN  CLINIC 35 tablet 1   No current facility-administered medications for this visit.    Allergies:   Penicillins, Simvastatin, and Tetracyclines & related    Social History:  The patient  reports that he has been smoking cigarettes. He has been smoking about 0.25 packs per day for the past 0.00 years. He has never used smokeless tobacco. He reports current alcohol use. He reports that he does not use drugs.   Family History:  The patient's family history is not on file.    ROS:  Please see the history of present illness.   Otherwise, review of systems are positive for none.   All other systems are reviewed and negative.    PHYSICAL EXAM: VS:  BP (!) 150/60 (BP Location: Left Arm, Patient Position: Sitting, Cuff  Size: Normal)   Pulse 61   Ht 5\' 8"  (1.727 m)   Wt 183 lb (83 kg)   SpO2 98%   BMI 27.83 kg/m  , BMI Body mass index is 27.83 kg/m. GEN: Well nourished, well developed, in no acute distress  HEENT: normal  Neck: no JVD, carotid bruits, or masses Cardiac: RRR with normal mechanical heart sounds; no  rubs, or gallops,no edema . 1/6 systolic ejection murmur in the aortic area Respiratory:  clear to auscultation bilaterally, normal work of breathing GI: soft, nontender, nondistended, + BS MS: no deformity or atrophy  Skin: warm and dry, no rash Neuro:  Strength and sensation are intact Psych: euthymic mood, full affect   EKG:  EKG is ordered today. The ekg ordered today demonstrates normal  sinus rhythm with minimal LVH.   Recent Labs: 11/18/2019: ALT 16; BUN 16; Creatinine, Ser 0.99; Potassium 5.1; Sodium 139    Lipid Panel    Component Value Date/Time   CHOL 243 (H) 11/18/2019 1022   TRIG 82 11/18/2019 1022   HDL 60 11/18/2019 1022   CHOLHDL 4.1 11/18/2019 1022   LDLCALC 169 (H) 11/18/2019 1022      Wt Readings from Last 3 Encounters:  09/10/20 183 lb (83 kg)  03/11/20 185 lb 8 oz (84.1 kg)  11/18/19 188 lb (85.3 kg)       No flowsheet data found.    ASSESSMENT AND PLAN:  1.  Status post aortic valve replacement with mechanical valve: Currently with no symptoms suggestive of angina or heart failure.  Echocardiogram in 2018 showed normal functioning mechanical aortic valve with normal ejection fraction.  Continue long-term anticoagulation with a target INR between 2 and 3.  2. Tobacco use: He cut down significantly but did not quit.  I discussed with him the importance of complete smoking cessation.    3.  Essential hypertension: Blood pressure is mildly elevated but he reports that his blood pressures controlled at home.  I asked him to check his blood pressure at least 3 times per week and bring the readings with him to his next Coumadin clinic so we can review.  4.  Hyperlipidemia: I discussed with him the importance of taking atorvastatin regularly.  Disposition:   FU with me in 1 year.   Signed,  2019, MD  09/10/2020 10:01 AM    [facility]

## 2020-09-10 NOTE — Patient Instructions (Signed)
Medication Instructions:  - Your physician recommends that you continue on your current medications as directed. Please refer to the Current Medication list given to you today.  *If you need a refill on your cardiac medications before your next appointment, please call your pharmacy*   Lab Work: - none ordered  If you have labs (blood work) drawn today and your tests are completely normal, you will receive your results only by: Marland Kitchen MyChart Message (if you have MyChart) OR . A paper copy in the mail If you have any lab test that is abnormal or we need to change your treatment, we will call you to review the results.   Testing/Procedures: - none ordered   Follow-Up: At [facility], you and your health needs are our priority.  As part of our continuing mission to provide you with exceptional heart care, we have created designated Provider Care Teams.  These Care Teams include your primary Cardiologist (physician) and Advanced Practice Providers (APPs -  Physician Assistants and Nurse Practitioners) who all work together to provide you with the care you need, when you need it.  We recommend signing up for the patient portal called "MyChart".  Sign up information is provided on this After Visit Summary.  MyChart is used to connect with patients for Virtual Visits (Telemedicine).  Patients are able to view lab/test results, encounter notes, upcoming appointments, etc.  Non-urgent messages can be sent to your provider as well.   To learn more about what you can do with MyChart, go to [email or web address].    Your next appointment:   1 year(s)  The format for your next appointment:   In Person  Provider:   You may see Lorine Bears, MD or one of the following Advanced Practice Providers on your designated Care Team:    Nicolasa Ducking, NP  Eula Listen, PA-C  Marisue Ivan, PA-C  Cadence Fransico Michael, [place]    Other Instructions  1) Check your blood pressure 3 times a week  2)  Please bring these readings with you when you come in for your coumadin appointment in 1 month   How to Take Your Blood Pressure You can take your blood pressure at home with a machine. You may need to check your blood pressure at home:  To check if you have high blood pressure (hypertension).  To check your blood pressure over time.  To make sure your blood pressure medicine is working. Supplies needed: You will need a blood pressure machine, or monitor. You can buy one at a drugstore or online. When choosing one:  Choose one with an arm cuff.  Choose one that wraps around your upper arm. Only one finger should fit between your arm and the cuff.  Do not choose one that measures your blood pressure from your wrist or finger. Your doctor can suggest a monitor. How to prepare Avoid these things for 30 minutes before checking your blood pressure:  Drinking caffeine.  Drinking alcohol.  Eating.  Smoking.  Exercising. Five minutes before checking your blood pressure:  Pee.  Sit in a dining chair. Avoid sitting in a soft couch or armchair.  Be quiet. Do not talk. How to take your blood pressure Follow the instructions that came with your machine. If you have a digital blood pressure monitor, these may be the instructions: 1. Sit up straight. 2. Place your feet on the floor. Do not cross your ankles or legs. 3. Rest your left arm at the level of your heart.  You may rest it on a table, desk, or chair. 4. Pull up your shirt sleeve. 5. Wrap the blood pressure cuff around the upper part of your left arm. The cuff should be 1 inch (2.5 cm) above your elbow. It is best to wrap the cuff around bare skin. 6. Fit the cuff snugly around your arm. You should be able to place only one finger between the cuff and your arm. 7. Put the cord inside the groove of your elbow. 8. Press the power button. 9. Sit quietly while the cuff fills with air and loses air. 10. Write down the numbers on  the screen. 11. Wait 2-3 minutes and then repeat steps 1-10. What do the numbers mean? Two numbers make up your blood pressure. The first number is called systolic pressure. The second is called diastolic pressure. An example of a blood pressure reading is "120 over 80" (or 120/80). If you are an adult and do not have a medical condition, use this guide to find out if your blood pressure is normal: Normal  First number: below 120.  Second number: below 80. Elevated  First number: 120-129.  Second number: below 80. Hypertension stage 1  First number: 130-139.  Second number: 80-89. Hypertension stage 2  First number: 140 or above.  Second number: 90 or above. Your blood pressure is above normal even if only the top or bottom number is above normal. Follow these instructions at home:  Check your blood pressure as often as your doctor tells you to.  Take your monitor to your next doctor's appointment. Your doctor will: ? Make sure you are using it correctly. ? Make sure it is working right.  Make sure you understand what your blood pressure numbers should be.  Tell your doctor if your medicines are causing side effects. Contact a doctor if:  Your blood pressure keeps being high. Get help right away if:  Your first blood pressure number is higher than 180.  Your second blood pressure number is higher than 120. This information is not intended to replace advice given to you by your health care provider. Make sure you discuss any questions you have with your health care provider. Document Revised: 10/27/2017 Document Reviewed: 04/22/2016 Elsevier Patient Education  2020 [employer].

## 2020-09-14 ENCOUNTER — Other Ambulatory Visit: Payer: Self-pay | Admitting: Cardiovascular Disease

## 2020-09-14 NOTE — Telephone Encounter (Signed)
Refill request

## 2020-11-02 ENCOUNTER — Other Ambulatory Visit: Payer: Self-pay

## 2020-11-02 ENCOUNTER — Ambulatory Visit (INDEPENDENT_AMBULATORY_CARE_PROVIDER_SITE_OTHER): Payer: Self-pay

## 2020-11-02 DIAGNOSIS — I359 Nonrheumatic aortic valve disorder, unspecified: Secondary | ICD-10-CM

## 2020-11-02 DIAGNOSIS — Z952 Presence of prosthetic heart valve: Secondary | ICD-10-CM

## 2020-11-02 DIAGNOSIS — Z7901 Long term (current) use of anticoagulants: Secondary | ICD-10-CM

## 2020-11-02 LAB — POCT INR: INR: 2.6 (ref 2.0–3.0)

## 2020-11-02 NOTE — Patient Instructions (Signed)
-   continue same warfarin dosage 17.5mg daily except 7.5mg on Mondays and Fridays.   °- Recheck in 6 weeks. °

## 2020-12-07 ENCOUNTER — Other Ambulatory Visit: Payer: Self-pay | Admitting: Cardiovascular Disease

## 2020-12-07 NOTE — Telephone Encounter (Signed)
Refill request

## 2020-12-16 ENCOUNTER — Other Ambulatory Visit: Payer: Self-pay

## 2020-12-16 ENCOUNTER — Ambulatory Visit (INDEPENDENT_AMBULATORY_CARE_PROVIDER_SITE_OTHER): Payer: Self-pay

## 2020-12-16 DIAGNOSIS — Z952 Presence of prosthetic heart valve: Secondary | ICD-10-CM

## 2020-12-16 DIAGNOSIS — Z7901 Long term (current) use of anticoagulants: Secondary | ICD-10-CM

## 2020-12-16 DIAGNOSIS — I359 Nonrheumatic aortic valve disorder, unspecified: Secondary | ICD-10-CM

## 2020-12-16 LAB — POCT INR: INR: 3.1 — AB (ref 2.0–3.0)

## 2020-12-16 NOTE — Patient Instructions (Signed)
-   have a large serving of greens today, then - continue same warfarin dosage 17.5mg  daily except 7.5mg  on Mondays and Fridays.   - Recheck in 6 weeks.

## 2021-02-03 ENCOUNTER — Ambulatory Visit (INDEPENDENT_AMBULATORY_CARE_PROVIDER_SITE_OTHER): Payer: Self-pay

## 2021-02-03 ENCOUNTER — Other Ambulatory Visit: Payer: Self-pay

## 2021-02-03 DIAGNOSIS — I359 Nonrheumatic aortic valve disorder, unspecified: Secondary | ICD-10-CM

## 2021-02-03 DIAGNOSIS — Z952 Presence of prosthetic heart valve: Secondary | ICD-10-CM

## 2021-02-03 DIAGNOSIS — Z7901 Long term (current) use of anticoagulants: Secondary | ICD-10-CM

## 2021-02-03 LAB — POCT INR: INR: 2.5 (ref 2.0–3.0)

## 2021-02-03 NOTE — Patient Instructions (Signed)
-   continue same warfarin dosage 17.5mg daily except 7.5mg on Mondays and Fridays.   °- Recheck in 6 weeks. °

## 2021-02-05 ENCOUNTER — Other Ambulatory Visit: Payer: Self-pay | Admitting: Family

## 2021-02-05 ENCOUNTER — Other Ambulatory Visit: Payer: Self-pay | Admitting: Cardiovascular Disease

## 2021-02-05 DIAGNOSIS — Z952 Presence of prosthetic heart valve: Secondary | ICD-10-CM

## 2021-02-05 DIAGNOSIS — I10 Essential (primary) hypertension: Secondary | ICD-10-CM

## 2021-02-05 NOTE — Telephone Encounter (Signed)
Lov: 09/10/2020, Dr. Kirke Corin  Due to have INR checked on 4/20  Pt has warfarin 7.5mg  tablets and warfarin 10mg  tablets.

## 2021-03-10 ENCOUNTER — Other Ambulatory Visit: Payer: Self-pay | Admitting: Physician Assistant

## 2021-03-10 DIAGNOSIS — E782 Mixed hyperlipidemia: Secondary | ICD-10-CM

## 2021-04-07 ENCOUNTER — Ambulatory Visit (INDEPENDENT_AMBULATORY_CARE_PROVIDER_SITE_OTHER): Payer: Self-pay

## 2021-04-07 ENCOUNTER — Other Ambulatory Visit: Payer: Self-pay

## 2021-04-07 DIAGNOSIS — I359 Nonrheumatic aortic valve disorder, unspecified: Secondary | ICD-10-CM

## 2021-04-07 DIAGNOSIS — Z7901 Long term (current) use of anticoagulants: Secondary | ICD-10-CM

## 2021-04-07 DIAGNOSIS — Z952 Presence of prosthetic heart valve: Secondary | ICD-10-CM

## 2021-04-07 LAB — POCT INR: INR: 2 (ref 2.0–3.0)

## 2021-04-07 NOTE — Patient Instructions (Signed)
-   continue same warfarin dosage 17.5mg daily except 7.5mg on Mondays and Fridays.   °- Recheck in 6 weeks. °

## 2021-05-26 ENCOUNTER — Other Ambulatory Visit: Payer: Self-pay

## 2021-05-26 ENCOUNTER — Ambulatory Visit (INDEPENDENT_AMBULATORY_CARE_PROVIDER_SITE_OTHER): Payer: Self-pay | Admitting: Pharmacist

## 2021-05-26 DIAGNOSIS — Z7901 Long term (current) use of anticoagulants: Secondary | ICD-10-CM

## 2021-05-26 DIAGNOSIS — Z952 Presence of prosthetic heart valve: Secondary | ICD-10-CM

## 2021-05-26 DIAGNOSIS — I359 Nonrheumatic aortic valve disorder, unspecified: Secondary | ICD-10-CM

## 2021-05-26 LAB — POCT INR: INR: 2.5 (ref 2.0–3.0)

## 2021-05-26 NOTE — Patient Instructions (Signed)
Description   - continue same warfarin dosage 17.5mg  daily except 7.5mg  on Mondays and Fridays.   - Recheck in 6 weeks.

## 2021-06-01 ENCOUNTER — Other Ambulatory Visit: Payer: Self-pay | Admitting: Cardiovascular Disease

## 2021-06-01 NOTE — Telephone Encounter (Signed)
Please review for refill, Thanks !  

## 2021-07-14 ENCOUNTER — Ambulatory Visit (INDEPENDENT_AMBULATORY_CARE_PROVIDER_SITE_OTHER): Payer: Self-pay

## 2021-07-14 ENCOUNTER — Other Ambulatory Visit: Payer: Self-pay

## 2021-07-14 DIAGNOSIS — I359 Nonrheumatic aortic valve disorder, unspecified: Secondary | ICD-10-CM

## 2021-07-14 DIAGNOSIS — Z7901 Long term (current) use of anticoagulants: Secondary | ICD-10-CM

## 2021-07-14 DIAGNOSIS — Z952 Presence of prosthetic heart valve: Secondary | ICD-10-CM

## 2021-07-14 LAB — POCT INR: INR: 2.1 (ref 2.0–3.0)

## 2021-07-14 NOTE — Patient Instructions (Signed)
-   continue same warfarin dosage 17.5mg daily except 7.5mg on Mondays and Fridays.   °- Recheck in 6 weeks. °

## 2021-08-01 ENCOUNTER — Other Ambulatory Visit: Payer: Self-pay | Admitting: Cardiovascular Disease

## 2021-08-03 NOTE — Telephone Encounter (Signed)
Please review for refill, Thanks !  

## 2021-08-28 ENCOUNTER — Other Ambulatory Visit: Payer: Self-pay | Admitting: Cardiovascular Disease

## 2021-08-28 DIAGNOSIS — I10 Essential (primary) hypertension: Secondary | ICD-10-CM

## 2021-08-28 DIAGNOSIS — Z952 Presence of prosthetic heart valve: Secondary | ICD-10-CM

## 2021-09-01 ENCOUNTER — Other Ambulatory Visit: Payer: Self-pay

## 2021-09-01 ENCOUNTER — Ambulatory Visit (INDEPENDENT_AMBULATORY_CARE_PROVIDER_SITE_OTHER): Payer: Self-pay

## 2021-09-01 DIAGNOSIS — I359 Nonrheumatic aortic valve disorder, unspecified: Secondary | ICD-10-CM

## 2021-09-01 DIAGNOSIS — Z952 Presence of prosthetic heart valve: Secondary | ICD-10-CM

## 2021-09-01 DIAGNOSIS — Z7901 Long term (current) use of anticoagulants: Secondary | ICD-10-CM

## 2021-09-01 LAB — POCT INR: INR: 2.3 (ref 2.0–3.0)

## 2021-09-01 NOTE — Patient Instructions (Signed)
-   continue same warfarin dosage 17.5mg daily except 7.5mg on Mondays and Fridays.   °- Recheck in 6 weeks. °

## 2021-09-27 ENCOUNTER — Other Ambulatory Visit: Payer: Self-pay | Admitting: Cardiovascular Disease

## 2021-09-28 NOTE — Telephone Encounter (Signed)
INR checked on 09/01/21   INR 2.3 Appt with Dr Kirke Corin on 10/01/21  Warfarin refill approved

## 2021-10-01 ENCOUNTER — Other Ambulatory Visit: Payer: Self-pay

## 2021-10-01 ENCOUNTER — Encounter: Payer: Self-pay | Admitting: Cardiovascular Disease

## 2021-10-01 ENCOUNTER — Ambulatory Visit (INDEPENDENT_AMBULATORY_CARE_PROVIDER_SITE_OTHER): Payer: Self-pay | Admitting: Cardiovascular Disease

## 2021-10-01 VITALS — BP 158/70 | HR 73 | Resp 98 | Ht 69.0 in | Wt 180.0 lb

## 2021-10-01 DIAGNOSIS — I10 Essential (primary) hypertension: Secondary | ICD-10-CM

## 2021-10-01 DIAGNOSIS — E782 Mixed hyperlipidemia: Secondary | ICD-10-CM

## 2021-10-01 DIAGNOSIS — I359 Nonrheumatic aortic valve disorder, unspecified: Secondary | ICD-10-CM

## 2021-10-01 NOTE — Progress Notes (Signed)
Cardiology Office Note   Date:  10/01/2021   ID:  Trevor Santiago, DOB 09-18-1956, MRN [id number]  PCP:  Tamsen Roers, PA-C (Inactive)  Cardiologist:   Lorine Bears, MD   Chief Complaint  Patient presents with   Other    12 Month f/u pt d/c Atorvastatin due to Myalgias. Meds reviewed verbally with pt.       History of Present Illness: Trevor Santiago is a 65 y.o. male who presents for a follow-up visit regarding aortic valve replacement with mechanical aortic valve. He presented in 2009 with symptomatic critical aortic stenosis due to bicuspid aortic valve. He underwent aortic valve replacement with an investigational mechanical aortic valve at [facility] without complications. Cardiac catheterization before surgery showed no significant coronary artery disease with mildly dilated ascending aorta. Maintaining therapeutic INR was extremely difficult and required a very large dose of warfarin.  He has been stable on the current dosing.  He retired in 2018 to help his wife at home who has been sick.  The patient lost his health insurance as a result.  He is trying to quit smoking.    He has been doing well and denies chest pain or shortness of breath.  He does admit to not taking losartan.  His blood pressure is high today.   Past Medical History:  Diagnosis Date   Aortic insufficiency    a. 09/2016 Echo: mild to mod AI (mech valve prosthesis).   Back pain    Bicuspid aortic valve w/ severe Ao Stenosis    a. 2009 s/p mechanical aortic valve replacement at [facility]; b. 09/2016 Echo: EF 60-65%, no rwma, mild to mod AI, mild MR, nl RV fxn.   Essential hypertension    Hyperlipidemia    Tobacco abuse     Past Surgical History:  Procedure Laterality Date   AORTIC VALVE REPLACEMENT     EXTERNAL EAR SURGERY       Current Outpatient Medications  Medication Sig Dispense Refill   Coenzyme Q10 (CO Q 10 PO) Take by mouth daily.     losartan (COZAAR) 25 MG tablet Take 25 mg by mouth  daily.     metoprolol succinate (TOPROL-XL) 25 MG 24 hr tablet Take 1 tablet by mouth once daily 90 tablet 0   Omega-3 Fatty Acids (FISH OIL PO) Take by mouth daily.     warfarin (COUMADIN) 10 MG tablet USE AS DIRECTED BY  COUMADIN  CLINIC 30 tablet 3   warfarin (COUMADIN) 7.5 MG tablet USE AS DIRECTED BY  COUMADIN  CLINIC 35 tablet 1   No current facility-administered medications for this visit.    Allergies:   Atorvastatin, Penicillins, Simvastatin, and Tetracyclines & related    Social History:  The patient  reports that he has been smoking cigarettes. He has been smoking an average of 0.25 packs per day. He has never used smokeless tobacco. He reports current alcohol use. He reports that he does not use drugs.   Family History:  The patient's family history is not on file.    ROS:  Please see the history of present illness.   Otherwise, review of systems are positive for none.   All other systems are reviewed and negative.    PHYSICAL EXAM: VS:  BP (!) 158/70 (BP Location: Left Arm, Patient Position: Sitting, Cuff Size: Normal)   Pulse 73   Resp (!) 98   Ht 5\' 9"  (1.753 m)   Wt 180 lb (81.6 kg)  BMI 26.58 kg/m  , BMI Body mass index is 26.58 kg/m. GEN: Well nourished, well developed, in no acute distress  HEENT: normal  Neck: no JVD, carotid bruits, or masses Cardiac: RRR with normal mechanical heart sounds; no  rubs, or gallops,no edema . 1/6 systolic ejection murmur in the aortic area Respiratory:  clear to auscultation bilaterally, normal work of breathing GI: soft, nontender, nondistended, + BS MS: no deformity or atrophy  Skin: warm and dry, no rash Neuro:  Strength and sensation are intact Psych: euthymic mood, full affect   EKG:  EKG is ordered today. The ekg ordered today demonstrates sinus rhythm with sinus arrhythmia, minimal LVH with repolarization abnormalities.   Recent Labs: No results found for requested labs within last 8760 hours.    Lipid  Panel    Component Value Date/Time   CHOL 243 (H) 11/18/2019 1022   TRIG 82 11/18/2019 1022   HDL 60 11/18/2019 1022   CHOLHDL 4.1 11/18/2019 1022   LDLCALC 169 (H) 11/18/2019 1022      Wt Readings from Last 3 Encounters:  10/01/21 180 lb (81.6 kg)  09/10/20 183 lb (83 kg)  03/11/20 185 lb 8 oz (84.1 kg)       No flowsheet data found.    ASSESSMENT AND PLAN:  1.  Status post aortic valve replacement with mechanical valve: Currently with no symptoms suggestive of angina or heart failure.  Echocardiogram in 2018 showed normal functioning mechanical aortic valve with normal ejection fraction.  Continue long-term anticoagulation with a target INR between 2 and 3.  We will plan on repeat echocardiogram in 1 to 2 years especially with his known dilated ascending aorta.  2. Tobacco use: He cut down significantly but did not quit.  I discussed with him the importance of complete smoking cessation.    3.  Essential hypertension: His blood pressure is not controlled.  He does admit to not taking losartan.  I discussed with him the importance of controlling his blood pressure especially in the setting of mildly dilated ascending aorta.  He agreed to resume losartan 25 mg daily.  Check basic metabolic profile today.  4.  Hyperlipidemia: He reports intolerance to atorvastatin due to myalgia.  He has not been taking the medication.  Check lipid and liver profile today and if LDL is significantly elevated, will try a small dose rosuvastatin.   Disposition:   FU with me in 1 year.   Signed,  Lorine Bears, MD  10/01/2021 3:22 PM    [facility]

## 2021-10-01 NOTE — Patient Instructions (Signed)
Medication Instructions:  - Your physician has recommended you make the following change in your medication:   1) RESUME Losartan 25 mg- take 1 tablet by mouth once daily   *If you need a refill on your cardiac medications before your next appointment, please call your pharmacy*   Lab Work: - Your physician recommends that you have lab work today: CBC/ CMET/ Lipid  If you have labs (blood work) drawn today and your tests are completely normal, you will receive your results only by: [employer] (if you have MyChart) OR A paper copy in the mail If you have any lab test that is abnormal or we need to change your treatment, we will call you to review the results.   Testing/Procedures: - none ordered   Follow-Up: At [facility], you and your health needs are our priority.  As part of our continuing mission to provide you with exceptional heart care, we have created designated Provider Care Teams.  These Care Teams include your primary Cardiologist (physician) and Advanced Practice Providers (APPs -  Physician Assistants and Nurse Practitioners) who all work together to provide you with the care you need, when you need it.  We recommend signing up for the patient portal called "MyChart".  Sign up information is provided on this After Visit Summary.  MyChart is used to connect with patients for Virtual Visits (Telemedicine).  Patients are able to view lab/test results, encounter notes, upcoming appointments, etc.  Non-urgent messages can be sent to your provider as well.   To learn more about what you can do with MyChart, go to [email or web address].    Your next appointment:   1 year(s)  The format for your next appointment:   In Person  Provider:   You may see Lorine Bears, MD or one of the following Advanced Practice Providers on your designated Care Team:   Nicolasa Ducking, NP Eula Listen, PA-C Cadence Fransico Michael, [place]    Other Instructions N/a

## 2021-10-02 LAB — CBC WITH DIFFERENTIAL/PLATELET
Basophils Absolute: 0.1 10*3/uL (ref 0.0–0.2)
Basos: 1 %
EOS (ABSOLUTE): 0.6 10*3/uL — ABNORMAL HIGH (ref 0.0–0.4)
Eos: 12 %
Hematocrit: 37.4 % — ABNORMAL LOW (ref 37.5–51.0)
Hemoglobin: 12.5 g/dL — ABNORMAL LOW (ref 13.0–17.7)
Immature Grans (Abs): 0 10*3/uL (ref 0.0–0.1)
Immature Granulocytes: 0 %
Lymphocytes Absolute: 1.3 10*3/uL (ref 0.7–3.1)
Lymphs: 25 %
MCH: 29.8 pg (ref 26.6–33.0)
MCHC: 33.4 g/dL (ref 31.5–35.7)
MCV: 89 fL (ref 79–97)
Monocytes Absolute: 0.6 10*3/uL (ref 0.1–0.9)
Monocytes: 11 %
Neutrophils Absolute: 2.7 10*3/uL (ref 1.4–7.0)
Neutrophils: 51 %
Platelets: 216 10*3/uL (ref 150–450)
RBC: 4.2 x10E6/uL (ref 4.14–5.80)
RDW: 14.7 % (ref 11.6–15.4)
WBC: 5.2 10*3/uL (ref 3.4–10.8)

## 2021-10-02 LAB — COMPREHENSIVE METABOLIC PANEL
ALT: 31 IU/L (ref 0–44)
AST: 24 IU/L (ref 0–40)
Albumin/Globulin Ratio: 1.6 (ref 1.2–2.2)
Albumin: 4.2 g/dL (ref 3.8–4.8)
Alkaline Phosphatase: 49 IU/L (ref 44–121)
BUN/Creatinine Ratio: 20 (ref 10–24)
BUN: 18 mg/dL (ref 8–27)
Bilirubin Total: 0.4 mg/dL (ref 0.0–1.2)
CO2: 22 mmol/L (ref 20–29)
Calcium: 9.3 mg/dL (ref 8.6–10.2)
Chloride: 105 mmol/L (ref 96–106)
Creatinine, Ser: 0.88 mg/dL (ref 0.76–1.27)
Globulin, Total: 2.7 g/dL (ref 1.5–4.5)
Glucose: 88 mg/dL (ref 70–99)
Potassium: 4.6 mmol/L (ref 3.5–5.2)
Sodium: 139 mmol/L (ref 134–144)
Total Protein: 6.9 g/dL (ref 6.0–8.5)
eGFR: 97 mL/min/{1.73_m2} (ref >59)

## 2021-10-02 LAB — LIPID PANEL
Chol/HDL Ratio: 3.8 ratio (ref 0.0–5.0)
Cholesterol, Total: 218 mg/dL — ABNORMAL HIGH (ref 100–199)
HDL: 57 mg/dL (ref >39)
LDL Chol Calc (NIH): 146 mg/dL — ABNORMAL HIGH (ref 0–99)
Triglycerides: 84 mg/dL (ref 0–149)
VLDL Cholesterol Cal: 15 mg/dL (ref 5–40)

## 2021-10-06 NOTE — Addendum Note (Signed)
Addended by: Margrett Rud on: 10/06/2021 11:55 AM   Modules accepted: Orders

## 2021-10-07 ENCOUNTER — Telehealth: Payer: Self-pay

## 2021-10-07 DIAGNOSIS — D649 Anemia, unspecified: Secondary | ICD-10-CM

## 2021-10-07 DIAGNOSIS — Z7901 Long term (current) use of anticoagulants: Secondary | ICD-10-CM

## 2021-10-07 MED ORDER — ROSUVASTATIN CALCIUM 5 MG PO TABS: 5.0000 mg | ORAL_TABLET | Freq: Every day | ORAL | 11 refills | Status: DC

## 2021-10-07 NOTE — Telephone Encounter (Signed)
-----   Message from Iran Ouch, MD sent at 10/07/2021 10:26 AM EST ----- Inform patient that labs were normal except for mild anemia.  Let's check stool occult blood.  Regardless, he should consider having a colonoscopy done given his age. His cholesterol was elevated.  Recommend adding rosuvastatin 5 mg once daily.

## 2021-10-07 NOTE — Telephone Encounter (Addendum)
Patient made aware of lab results and Dr. Jari Sportsman recommendation. Patient is agreeable with the plan.  Order placed for a fecal occult stool check. Advised the patient to report the [facility] lab to receive the stool card. He is to follow their instructions on the collection and returning of the sample.  He will contact his pcp to discuss getting his colonoscopy screening.  Rx for rosuvastatin 5 mg daily has been sent to the patients pharmacy. Patient has not tolerated other statins in the past. Adv the patient to contact the office if symptoms develop.  Patient verbalized understanding to the info given and has no questions or concerns at this time.

## 2021-10-13 ENCOUNTER — Other Ambulatory Visit: Payer: Self-pay

## 2021-10-13 ENCOUNTER — Ambulatory Visit (INDEPENDENT_AMBULATORY_CARE_PROVIDER_SITE_OTHER): Payer: Self-pay

## 2021-10-13 DIAGNOSIS — Z952 Presence of prosthetic heart valve: Secondary | ICD-10-CM

## 2021-10-13 DIAGNOSIS — Z7901 Long term (current) use of anticoagulants: Secondary | ICD-10-CM

## 2021-10-13 DIAGNOSIS — I359 Nonrheumatic aortic valve disorder, unspecified: Secondary | ICD-10-CM

## 2021-10-13 LAB — POCT INR: INR: 2.4 (ref 2.0–3.0)

## 2021-10-13 NOTE — Patient Instructions (Signed)
-   continue same warfarin dosage 17.5mg daily except 7.5mg on Mondays and Fridays.   °- Recheck in 6 weeks. °

## 2021-11-24 ENCOUNTER — Other Ambulatory Visit: Payer: Self-pay

## 2021-11-24 ENCOUNTER — Ambulatory Visit (INDEPENDENT_AMBULATORY_CARE_PROVIDER_SITE_OTHER): Payer: Self-pay

## 2021-11-24 ENCOUNTER — Other Ambulatory Visit
Admission: RE | Admit: 2021-11-24 | Discharge: 2021-11-24 | Disposition: A | Discharge status: Home / Self Care | Payer: Self-pay | Source: Ambulatory Visit | Attending: Cardiovascular Disease | Admitting: Cardiovascular Disease

## 2021-11-24 DIAGNOSIS — I359 Nonrheumatic aortic valve disorder, unspecified: Secondary | ICD-10-CM

## 2021-11-24 DIAGNOSIS — Z7901 Long term (current) use of anticoagulants: Secondary | ICD-10-CM | POA: Insufficient documentation

## 2021-11-24 DIAGNOSIS — Z952 Presence of prosthetic heart valve: Secondary | ICD-10-CM

## 2021-11-24 DIAGNOSIS — D649 Anemia, unspecified: Secondary | ICD-10-CM | POA: Insufficient documentation

## 2021-11-24 LAB — POCT INR: INR: 2.1 (ref 2.0–3.0)

## 2021-11-24 LAB — OCCULT BLOOD X 1 CARD TO LAB, STOOL: Fecal Occult Bld: NEGATIVE

## 2021-11-24 NOTE — Patient Instructions (Signed)
-   continue same warfarin dosage 17.5mg  daily except 7.5mg  on Mondays and Fridays.   - Recheck in 6 weeks.

## 2021-11-25 ENCOUNTER — Other Ambulatory Visit: Payer: Self-pay | Admitting: Cardiovascular Disease

## 2022-01-12 ENCOUNTER — Other Ambulatory Visit: Payer: Self-pay

## 2022-01-12 ENCOUNTER — Ambulatory Visit (INDEPENDENT_AMBULATORY_CARE_PROVIDER_SITE_OTHER): Payer: Self-pay

## 2022-01-12 DIAGNOSIS — I359 Nonrheumatic aortic valve disorder, unspecified: Secondary | ICD-10-CM

## 2022-01-12 DIAGNOSIS — Z952 Presence of prosthetic heart valve: Secondary | ICD-10-CM

## 2022-01-12 DIAGNOSIS — Z7901 Long term (current) use of anticoagulants: Secondary | ICD-10-CM

## 2022-01-12 LAB — POCT INR: INR: 2 (ref 2.0–3.0)

## 2022-01-12 NOTE — Patient Instructions (Signed)
-   continue same warfarin dosage 17.5mg  daily except 7.5mg  on Mondays and Fridays.   - Recheck in 6 weeks.

## 2022-01-24 ENCOUNTER — Other Ambulatory Visit: Payer: Self-pay | Admitting: Cardiovascular Disease

## 2022-01-24 NOTE — Telephone Encounter (Signed)
Refill request

## 2022-01-26 ENCOUNTER — Telehealth: Payer: Self-pay | Admitting: Cardiovascular Disease

## 2022-01-26 DIAGNOSIS — T148XXA Other injury of unspecified body region, initial encounter: Secondary | ICD-10-CM

## 2022-01-26 DIAGNOSIS — Z7901 Long term (current) use of anticoagulants: Secondary | ICD-10-CM

## 2022-01-26 NOTE — Telephone Encounter (Signed)
? ?  Dr. Yehuda Savannah with [facility] in [facility] calling, she said pt is there today. She requesting to speak with Dr. Jari Sportsman nurse regarding pt ?

## 2022-01-26 NOTE — Telephone Encounter (Signed)
Dr. Vear Clock is requesting to speak with CV[facility] nurse regarding the patient f/u INR after d/c from [facility] today. ? ?Adv her that I will fwd the message to anti-coag for them to give her a call. ? ?

## 2022-01-26 NOTE — Telephone Encounter (Signed)
Spoke w/ Dr. Vear Clock @ [facility]  ?Pt is currently admitted there after a mechanical fall at home and large hematoma on his hip. ?Pt's warfarin was held, his INR was 2.5, but they gave oral Vit K.  ?He is being discharged today and she would like for him to have his INR checked this Friday. ?Pt lives in [place] and does not want to drive to [facility] for INR check, so she is requesting that he have labs drawn at [facility] on Friday and have them sent to anti-coag for dosing.  ?Advised her that I will place order, pt does not need an appt, he can go anytime on Friday and I will make [facility] office aware to be on the look out for his results. ?She is appreciative of the call and will let us know if we can be of further assistance.  ?

## 2022-01-28 ENCOUNTER — Ambulatory Visit (INDEPENDENT_AMBULATORY_CARE_PROVIDER_SITE_OTHER): Payer: Self-pay | Admitting: Internal Medicine

## 2022-01-28 ENCOUNTER — Ambulatory Visit: Payer: Self-pay

## 2022-01-28 ENCOUNTER — Other Ambulatory Visit
Admission: RE | Admit: 2022-01-28 | Discharge: 2022-01-28 | Disposition: A | Discharge status: Home / Self Care | Payer: Self-pay | Attending: Cardiovascular Disease | Admitting: Cardiovascular Disease

## 2022-01-28 DIAGNOSIS — Z5181 Encounter for therapeutic drug level monitoring: Secondary | ICD-10-CM

## 2022-01-28 DIAGNOSIS — T148XXA Other injury of unspecified body region, initial encounter: Secondary | ICD-10-CM | POA: Insufficient documentation

## 2022-01-28 DIAGNOSIS — Z7901 Long term (current) use of anticoagulants: Secondary | ICD-10-CM | POA: Insufficient documentation

## 2022-01-28 LAB — PROTIME-INR
INR: 1.3 — ABNORMAL HIGH (ref 0.8–1.2)
Prothrombin Time: 16.4 seconds — ABNORMAL HIGH (ref 11.4–15.2)

## 2022-01-28 NOTE — Patient Instructions (Signed)
Description   ? Called and spoke to pt and his wife and instructed pt to continue to take warfarin 13.75mg  daily. Recheck INR on 3/8. [facility] [phone] ?  ? ? ?

## 2022-02-02 ENCOUNTER — Other Ambulatory Visit: Payer: Self-pay

## 2022-02-02 ENCOUNTER — Other Ambulatory Visit
Admission: RE | Admit: 2022-02-02 | Discharge: 2022-02-02 | Disposition: A | Discharge status: Home / Self Care | Payer: Self-pay | Source: Ambulatory Visit | Attending: Internal Medicine | Admitting: Internal Medicine

## 2022-02-02 ENCOUNTER — Ambulatory Visit (INDEPENDENT_AMBULATORY_CARE_PROVIDER_SITE_OTHER): Payer: Self-pay

## 2022-02-02 DIAGNOSIS — Z952 Presence of prosthetic heart valve: Secondary | ICD-10-CM

## 2022-02-02 DIAGNOSIS — Z7901 Long term (current) use of anticoagulants: Secondary | ICD-10-CM | POA: Insufficient documentation

## 2022-02-02 DIAGNOSIS — I359 Nonrheumatic aortic valve disorder, unspecified: Secondary | ICD-10-CM

## 2022-02-02 DIAGNOSIS — D649 Anemia, unspecified: Secondary | ICD-10-CM | POA: Insufficient documentation

## 2022-02-02 LAB — OCCULT BLOOD X 1 CARD TO LAB, STOOL: Fecal Occult Bld: NEGATIVE

## 2022-02-02 LAB — POCT INR: INR: 3 (ref 2.0–3.0)

## 2022-02-02 NOTE — Patient Instructions (Signed)
-   skip warfarin tonight, take 7.5 mg tomorrow, then ?- on Friday START NEW warfarin dosage of 17.5mg  daily except 7.5mg  on Mondays, Wednesdays and Fridays ?- Recheck 1 week ?

## 2022-02-09 ENCOUNTER — Other Ambulatory Visit: Payer: Self-pay

## 2022-02-09 ENCOUNTER — Ambulatory Visit (INDEPENDENT_AMBULATORY_CARE_PROVIDER_SITE_OTHER): Payer: Self-pay

## 2022-02-09 DIAGNOSIS — Z7901 Long term (current) use of anticoagulants: Secondary | ICD-10-CM

## 2022-02-09 DIAGNOSIS — Z952 Presence of prosthetic heart valve: Secondary | ICD-10-CM

## 2022-02-09 DIAGNOSIS — I359 Nonrheumatic aortic valve disorder, unspecified: Secondary | ICD-10-CM

## 2022-02-09 LAB — POCT INR: INR: 2.7 (ref 2.0–3.0)

## 2022-02-09 NOTE — Patient Instructions (Signed)
-   skip warfarin tonight, then ?- START NEW DOSAGE of warfarin dosage of 10 mg every day ?- Recheck 1 week ?

## 2022-02-15 ENCOUNTER — Ambulatory Visit: Payer: Self-pay | Admitting: Cardiovascular Disease

## 2022-02-23 ENCOUNTER — Other Ambulatory Visit: Payer: Self-pay | Admitting: Cardiovascular Disease

## 2022-02-23 ENCOUNTER — Other Ambulatory Visit: Payer: Self-pay

## 2022-02-23 ENCOUNTER — Ambulatory Visit (INDEPENDENT_AMBULATORY_CARE_PROVIDER_SITE_OTHER): Payer: Self-pay

## 2022-02-23 DIAGNOSIS — I10 Essential (primary) hypertension: Secondary | ICD-10-CM

## 2022-02-23 DIAGNOSIS — Z952 Presence of prosthetic heart valve: Secondary | ICD-10-CM

## 2022-02-23 DIAGNOSIS — I359 Nonrheumatic aortic valve disorder, unspecified: Secondary | ICD-10-CM

## 2022-02-23 DIAGNOSIS — Z7901 Long term (current) use of anticoagulants: Secondary | ICD-10-CM

## 2022-02-23 LAB — POCT INR: INR: 1.4 — AB (ref 2.0–3.0)

## 2022-02-23 NOTE — Telephone Encounter (Signed)
Refill request for warfarin 

## 2022-02-23 NOTE — Patient Instructions (Signed)
-   continue dosage of warfarin dosage of 10 mg every day ?- Recheck 3 weeks ?

## 2022-03-15 ENCOUNTER — Encounter: Payer: Self-pay | Admitting: Nurse Practitioner

## 2022-03-15 ENCOUNTER — Ambulatory Visit (INDEPENDENT_AMBULATORY_CARE_PROVIDER_SITE_OTHER): Payer: Self-pay | Admitting: Nurse Practitioner

## 2022-03-15 VITALS — BP 156/56 | HR 76 | Ht 69.0 in | Wt 174.0 lb

## 2022-03-15 DIAGNOSIS — Z720 Tobacco use: Secondary | ICD-10-CM

## 2022-03-15 DIAGNOSIS — Z952 Presence of prosthetic heart valve: Secondary | ICD-10-CM

## 2022-03-15 DIAGNOSIS — E782 Mixed hyperlipidemia: Secondary | ICD-10-CM

## 2022-03-15 DIAGNOSIS — I10 Essential (primary) hypertension: Secondary | ICD-10-CM

## 2022-03-15 MED ORDER — LOSARTAN POTASSIUM 50 MG PO TABS: 50.0000 mg | ORAL_TABLET | Freq: Every day | ORAL | 3 refills | Status: DC

## 2022-03-15 MED ORDER — EZETIMIBE 10 MG PO TABS: 10.0000 mg | ORAL_TABLET | Freq: Every day | ORAL | 3 refills | Status: DC

## 2022-03-15 NOTE — Patient Instructions (Addendum)
Medication Instructions:  ?Your physician has recommended you make the following change in your medication:  ? ?INCREASE Losartan to 50 mg once daily  ?START Ezetimibe (Zetia) 10 mg once daily  ? ?*If you need a refill on your cardiac medications before your next appointment, please call your pharmacy* ? ? ?Lab Work: ?BMET in one week over at the [facility] entrance. Check in at registration and they will direct you from there.  ? ?If you have labs (blood work) drawn today and your tests are completely normal, you will receive your results only by: ?MyChart Message (if you have MyChart) OR ?A paper copy in the mail ?If you have any lab test that is abnormal or we need to change your treatment, we will call you to review the results. ? ? ?Testing/Procedures: ?None ? ? ?Follow-Up: ?At [facility], you and your health needs are our priority.  As part of our continuing mission to provide you with exceptional heart care, we have created designated Provider Care Teams.  These Care Teams include your primary Cardiologist (physician) and Advanced Practice Providers (APPs -  Physician Assistants and Nurse Practitioners) who all work together to provide you with the care you need, when you need it. ? ?We recommend signing up for the patient portal called "MyChart".  Sign up information is provided on this After Visit Summary.  MyChart is used to connect with patients for Virtual Visits (Telemedicine).  Patients are able to view lab/test results, encounter notes, upcoming appointments, etc.  Non-urgent messages can be sent to your provider as well.   ?To learn more about what you can do with MyChart, go to [email or web address].   ? ?Your next appointment:   ?6 month(s) ? ?The format for your next appointment:   ?In Person ? ?Provider:   ?Lorine Bears, MD or Nicolasa Ducking, NP  ? ? ? ? ?Important Information About Sugar ? ? ? ? ?  ?

## 2022-03-15 NOTE — Progress Notes (Signed)
? ? ?Office Visit  ?  ?Patient Name: Trevor Santiago ?Date of Encounter: 03/15/2022 ? ?Primary Care Provider:  Pcp, No ?Primary Cardiologist:  Lorine BearsMuhammad Arida, MD ? ?Chief Complaint  ?  ?66 y/o ? w/ a h/o severe aortic stenosis status post mechanical aortic valve replacement at [facility] in 2009, hypertension, hyperlipidemia, and ongoing tobacco abuse, who presents for follow-up related to aortic valve disease. ? ?Past Medical History  ?  ?Past Medical History:  ?Diagnosis Date  ? Aortic insufficiency   ? a. 09/2016 Echo: mild to mod AI (mech valve prosthesis).  ? Back pain   ? Bicuspid aortic valve w/ severe Ao Stenosis   ? a. 2009 s/p mechanical aortic valve replacement at [facility]; b. 09/2016 Echo: EF 60-65%, no rwma, mild to mod AI, mild MR, nl RV fxn.  ? Essential hypertension   ? Hyperlipidemia   ? Tobacco abuse   ? ?Past Surgical History:  ?Procedure Laterality Date  ? AORTIC VALVE REPLACEMENT    ? EXTERNAL EAR SURGERY    ? ? ?Allergies ? ?Allergies  ?Allergen Reactions  ? Atorvastatin   ?  Myalgias  ? Penicillins   ? Rosuvastatin   ?  Myalgias ?  ? Simvastatin   ?  Myalgias  ? Tetracyclines & Related   ? ? ?History of Present Illness  ?  ?66 year old male with the above past medical history including bicuspid aortic valve and development of critical aortic stenosis status post mechanical aortic valve replacement at [facility] in 2009.  Per notes, this was an investigational device at that time.  Other history includes hypertension, hyperlipidemia, and development of mild to moderate aortic insufficiency postoperatively.  Most recent echo in November 2017 showed normal LV function with mild to moderate AI, mild MR, and normal RV function.  He has been followed closely in our anticoagulation clinic and was last seen in cardiology clinic in November 2022, at which time he was doing well, though was hypertensive with recommendation to resume losartan 25 mg daily. ? ?Since his last visit, he has done well from a cardiovascular  standpoint.  He is reasonably active without symptoms or limitations and denies chest pain, dyspnea, palpitations, PND, orthopnea, dizziness, syncope, edema, or early satiety.  In March, he slipped and fell, striking his right shoulder resulting in dislocation.  [facility] has [facility] and was just recently cleared to stop wearing his sling.  He still has some pain related to his shoulder injury.  He continues to smoke 6 cigarettes/day.  He checks his blood pressure a few times a week and notes that his systolics are typically greater than 140.  He notes good compliance with losartan therapy but admits to adding salt to most of his food.  Since his last visit, he has come off of rosuvastatin therapy secondary to myalgias.  He would be willing to try Zetia. ? ?Home Medications  ?  ?Current Outpatient Medications  ?Medication Sig Dispense Refill  ? Coenzyme Q10 (CO Q 10 PO) Take by mouth daily.    ? ezetimibe (ZETIA) 10 MG tablet Take 1 tablet (10 mg total) by mouth daily. 90 tablet 3  ? losartan (COZAAR) 50 MG tablet Take 1 tablet (50 mg total) by mouth daily. 90 tablet 3  ? metoprolol succinate (TOPROL-XL) 25 MG 24 hr tablet Take 1 tablet by mouth once daily 90 tablet 1  ? Omega-3 Fatty Acids (FISH OIL PO) Take by mouth daily.    ? warfarin (COUMADIN)  10 MG tablet USE AS DIRECTED BY  COUMADIN  CLINIC 30 tablet 0  ? warfarin (COUMADIN) 7.5 MG tablet USE AS DIRECTED BY  COUMADIN  CLINIC (Patient not taking: Reported on 03/15/2022) 35 tablet 0  ? ?No current facility-administered medications for this visit.  ?  ? ?Review of Systems  ?  ?Ongoing right shoulder discomfort following dislocation in March.  He denies chest pain, palpitations, dyspnea, pnd, orthopnea, n, v, dizziness, syncope, edema, weight gain, or early satiety.  All other systems reviewed and are otherwise negative except as noted above. ?  ? ?Physical Exam  ?  ?VS:  BP (!) 156/56   Pulse 76   Ht 5\' 9"  (1.753 m)   Wt 174 lb (78.9 kg)    SpO2 98%   BMI 25.70 kg/m?  , BMI Body mass index is 25.7 kg/m?. ?    ?Vitals:  ? 03/15/22 1121 03/15/22 1213  ?BP: (!) 150/60 (!) 156/56  ?Pulse: 76   ?SpO2: 98%   ?  ?GEN: Well nourished, well developed, in no acute distress. ?HEENT: normal. ?Neck: Supple, no JVD, carotid bruits, or masses. ?Cardiac: 2/6 systolic murmur at the upper sternal borders with mechanical S2.  No rubs or gallops. No clubbing, cyanosis, edema.  Radials/DP/PT 2+ and equal bilaterally.  ?Respiratory:  Respirations regular and unlabored, clear to auscultation bilaterally. ?GI: Soft, nontender, nondistended, BS + x 4. ?MS: no deformity or atrophy. ?Skin: warm and dry, no rash. ?Neuro:  Strength and sensation are intact. ?Psych: Normal affect. ? ?Accessory Clinical Findings  ?  ? ?Lab Results  ?Component Value Date  ? WBC 5.2 10/01/2021  ? HGB 12.5 (L) 10/01/2021  ? HCT 37.4 (L) 10/01/2021  ? MCV 89 10/01/2021  ? PLT 216 10/01/2021  ? ?Lab Results  ?Component Value Date  ? CREATININE 0.88 10/01/2021  ? BUN 18 10/01/2021  ? NA 139 10/01/2021  ? K 4.6 10/01/2021  ? CL 105 10/01/2021  ? CO2 22 10/01/2021  ? ?Lab Results  ?Component Value Date  ? ALT 31 10/01/2021  ? AST 24 10/01/2021  ? ALKPHOS 49 10/01/2021  ? BILITOT 0.4 10/01/2021  ? ?Lab Results  ?Component Value Date  ? CHOL 218 (H) 10/01/2021  ? HDL 57 10/01/2021  ? LDLCALC 146 (H) 10/01/2021  ? TRIG 84 10/01/2021  ? CHOLHDL 3.8 10/01/2021  ?  ?Lab Results  ?Component Value Date  ? INR 1.4 (A) 02/23/2022  ? INR 2.7 02/09/2022  ? INR 3.0 02/02/2022  ?  ?Assessment & Plan  ?  ?1.  History of aortic stenosis status post mechanical aortic valve replacement: Status post mechanical AVR at [facility] in 2009.  Most recent echo in November 2017 showed normal LV function with mild to moderate AI and mild MR.  Patient has been doing well from a cardiovascular standpoint without chest pain, dyspnea, presyncope/syncope, or edema.  We will plan to follow-up echocardiogram in 2024.  Continue warfarin. ? ?2.   Essential hypertension: Blood pressure has been trending greater than 140 systolic at home and he is 156/56 today.  He reports compliance with losartan though admits to a high salt diet.  Discussed the importance of sodium restriction at home.  Increasing losartan to 50 mg daily with plan to follow-up basic metabolic panel next week. ? ?3.  Hyperlipidemia: LDL of 146 in November.  Previous intolerance to atorvastatin and simvastatin, and was most recently on rosuvastatin however, this also resulted in myalgias and he has no longer taking.  He is willing to try Zetia, and we will send in a prescription for 10 mg today. ? ?4.  Tobacco abuse: Continues to smoke 6 cigarettes a day.  He did not smoke any cigarettes yesterday and feels that he can quit on his own.  Complete cessation advised. ? ?5.  Disposition: Follow-up basic metabolic panel in 1 week.  Follow-up in clinic in 6 months with plan for follow-up lipids and LFTs at that time.  Follow-up echo in 2024. ? ? ?Nicolasa Ducking, NP ?03/15/2022, 12:16 PM ? ?

## 2022-03-16 ENCOUNTER — Ambulatory Visit (INDEPENDENT_AMBULATORY_CARE_PROVIDER_SITE_OTHER): Payer: Self-pay

## 2022-03-16 DIAGNOSIS — Z952 Presence of prosthetic heart valve: Secondary | ICD-10-CM

## 2022-03-16 DIAGNOSIS — Z7901 Long term (current) use of anticoagulants: Secondary | ICD-10-CM

## 2022-03-16 DIAGNOSIS — I359 Nonrheumatic aortic valve disorder, unspecified: Secondary | ICD-10-CM

## 2022-03-16 LAB — POCT INR: INR: 2 (ref 2.0–3.0)

## 2022-03-16 NOTE — Patient Instructions (Signed)
-   continue dosage of warfarin dosage of 10 mg every day - Recheck 6 weeks 

## 2022-04-27 ENCOUNTER — Ambulatory Visit (INDEPENDENT_AMBULATORY_CARE_PROVIDER_SITE_OTHER): Payer: Self-pay

## 2022-04-27 DIAGNOSIS — Z7901 Long term (current) use of anticoagulants: Secondary | ICD-10-CM

## 2022-04-27 DIAGNOSIS — Z952 Presence of prosthetic heart valve: Secondary | ICD-10-CM

## 2022-04-27 DIAGNOSIS — I359 Nonrheumatic aortic valve disorder, unspecified: Secondary | ICD-10-CM

## 2022-04-27 LAB — POCT INR: INR: 2.1 (ref 2.0–3.0)

## 2022-04-27 NOTE — Patient Instructions (Signed)
-   continue dosage of warfarin dosage of 10 mg every day - Recheck 6 weeks 

## 2022-06-04 ENCOUNTER — Other Ambulatory Visit: Payer: Self-pay | Admitting: Cardiovascular Disease

## 2022-06-06 NOTE — Telephone Encounter (Signed)
Received refill request for Warfarin:  Last INR was 2.1 on 04/27/22 Next INR due on 06/08/22 LOV was 03/15/22  C Burge PA-C  Refill approved.

## 2022-06-06 NOTE — Telephone Encounter (Signed)
Refill Request.  

## 2022-06-14 ENCOUNTER — Other Ambulatory Visit: Payer: Self-pay | Admitting: Cardiovascular Disease

## 2022-06-15 ENCOUNTER — Ambulatory Visit (INDEPENDENT_AMBULATORY_CARE_PROVIDER_SITE_OTHER): Payer: Self-pay

## 2022-06-15 DIAGNOSIS — Z952 Presence of prosthetic heart valve: Secondary | ICD-10-CM

## 2022-06-15 DIAGNOSIS — I359 Nonrheumatic aortic valve disorder, unspecified: Secondary | ICD-10-CM

## 2022-06-15 DIAGNOSIS — Z7901 Long term (current) use of anticoagulants: Secondary | ICD-10-CM

## 2022-06-15 LAB — POCT INR: INR: 1.6 — AB (ref 2.0–3.0)

## 2022-06-15 NOTE — Patient Instructions (Signed)
-   continue dosage of warfarin dosage of 10 mg every day - Recheck 6 weeks

## 2022-06-28 ENCOUNTER — Telehealth: Payer: Self-pay | Admitting: Cardiovascular Disease

## 2022-06-28 NOTE — Telephone Encounter (Signed)
[facility] called about the patient's warfarin (COUMADIN) 10 MG tablet, they can't get the brand the patient was on. They need the okay from the provider for the name brand they are getting now.

## 2022-06-29 NOTE — Telephone Encounter (Signed)
Yes, ok to dispense brand they have in stock

## 2022-06-30 ENCOUNTER — Telehealth: Payer: Self-pay

## 2022-06-30 NOTE — Telephone Encounter (Signed)
Tried calling pharmacy, open at 9:00.

## 2022-06-30 NOTE — Telephone Encounter (Signed)
I spoke to the pharmacy and gave the "Ok" per Trevor Santiago to supply the patient with the brand Warfarin they have available.  Verified understanding

## 2022-07-09 ENCOUNTER — Other Ambulatory Visit: Payer: Self-pay | Admitting: Cardiovascular Disease

## 2022-07-11 NOTE — Telephone Encounter (Signed)
Refill Request.  

## 2022-07-27 ENCOUNTER — Ambulatory Visit: Payer: Self-pay | Attending: Cardiovascular Disease

## 2022-07-27 DIAGNOSIS — Z952 Presence of prosthetic heart valve: Secondary | ICD-10-CM

## 2022-07-27 DIAGNOSIS — Z7901 Long term (current) use of anticoagulants: Secondary | ICD-10-CM

## 2022-07-27 DIAGNOSIS — I359 Nonrheumatic aortic valve disorder, unspecified: Secondary | ICD-10-CM

## 2022-07-27 LAB — POCT INR: INR: 1.5 — AB (ref 2.0–3.0)

## 2022-07-27 NOTE — Patient Instructions (Signed)
-   continue dosage of warfarin dosage of 10 mg every day - Recheck 6 weeks

## 2022-08-11 ENCOUNTER — Other Ambulatory Visit: Payer: Self-pay | Admitting: Cardiovascular Disease

## 2022-08-11 NOTE — Telephone Encounter (Signed)
Refill Request.  

## 2022-09-07 ENCOUNTER — Ambulatory Visit: Payer: Self-pay | Attending: Cardiovascular Disease

## 2022-09-07 DIAGNOSIS — I359 Nonrheumatic aortic valve disorder, unspecified: Secondary | ICD-10-CM

## 2022-09-07 DIAGNOSIS — Z7901 Long term (current) use of anticoagulants: Secondary | ICD-10-CM

## 2022-09-07 DIAGNOSIS — Z952 Presence of prosthetic heart valve: Secondary | ICD-10-CM

## 2022-09-07 LAB — POCT INR: INR: 1.9 — AB (ref 2.0–3.0)

## 2022-09-07 NOTE — Patient Instructions (Signed)
-   continue dosage of warfarin dosage of 10 mg every day - Recheck 8 weeks 

## 2022-09-22 ENCOUNTER — Other Ambulatory Visit: Payer: Self-pay | Admitting: Cardiovascular Disease

## 2022-09-22 NOTE — Telephone Encounter (Signed)
Prescription refill request received for warfarin Lov: Trevor Santiago, 03/15/2022 Next INR check: 12/6  Warfarin tablet strength: 10mg 

## 2022-10-27 NOTE — Progress Notes (Deleted)
Cardiology Office Note    Date:  10/27/2022   ID:  Trevor Santiago, DOB 1956/08/17, MRN [id number]  PCP:  Pcp, No  Cardiologist:  Lorine Bears, MD  Electrophysiologist:  None   Chief Complaint: Follow-up  History of Present Illness:   Trevor Santiago is a 66 y.o. male with history of bicuspid aortic valve with severe stenosis status post mechanical AVR at [facility] in 2009 on warfarin, HTN, HLD, and ongoing tobacco use who presents for follow-up of aortic valve disease.  He underwent mechanical AVR at [facility] in 2009.  Per notes, this was an investigational device at that time.  Postoperatively, he developed mild to moderate aortic insufficiency.  Most recent echo from 09/2016 showed normal LV systolic function with mild to moderate aortic insufficiency, mild mitral regurgitation, and normal RV systolic function.  He was last seen in our office on 02/2022 at which time he was without symptoms of angina or decompensation.  It was noted he had come off rosuvastatin secondary to myalgias.  He was adherent to medications.  He did continue to smoke approximately 6 cigarettes/day.  ***   Labs independently reviewed: 09/07/2022 - INR 1.9 01/2022 - potassium 4.2, BUN 13, serum creatinine 0.87, Hgb 8.1, PLT 242 12/2021 - albumin 3.2, AST/ALT normal 09/2021 - TC 218, TG 84, HDL 57, LDL 146 09/2016 - TSH normal  Past Medical History:  Diagnosis Date   Aortic insufficiency    a. 09/2016 Echo: mild to mod AI (mech valve prosthesis).   Back pain    Bicuspid aortic valve w/ severe Ao Stenosis    a. 2009 s/p mechanical aortic valve replacement at [facility]; b. 09/2016 Echo: EF 60-65%, no rwma, mild to mod AI, mild MR, nl RV fxn.   Essential hypertension    Hyperlipidemia    Tobacco abuse     Past Surgical History:  Procedure Laterality Date   AORTIC VALVE REPLACEMENT     EXTERNAL EAR SURGERY      Current Medications: No outpatient medications have been marked as taking for the 10/31/22 encounter  (Appointment) with Sondra Barges, PA-C.    Allergies:   Atorvastatin, Penicillins, Rosuvastatin, Simvastatin, and Tetracyclines & related   Social History   Socioeconomic History   Marital status: Married    Spouse name: Not on file   Number of children: Not on file   Years of education: Not on file   Highest education level: Not on file  Occupational History   Not on file  Tobacco Use   Smoking status: Every Day    Packs/day: 0.25    Years: 0.00    Total pack years: 0.00    Types: Cigarettes   Smokeless tobacco: Never  Vaping Use   Vaping Use: Never used  Substance and Sexual Activity   Alcohol use: Yes    Comment: occasional   Drug use: No   Sexual activity: Not on file  Other Topics Concern   Not on file  Social History Narrative   Not on file   Social Determinants of Health   Financial Resource Strain: Not on file  Food Insecurity: Not on file  Transportation Needs: Not on file  Physical Activity: Not on file  Stress: Not on file  Social Connections: Not on file     Family History:  The patient's family history is not on file.  ROS:   12-point review of systems is negative unless otherwise noted in HPI.   EKGs/Labs/Other Studies Reviewed:  Studies reviewed were summarized above. The additional studies were reviewed today:  2D echo 01/23/2022 [facility]): Summary   1. The left ventricle is normal in size with mildly increased wall  thickness.   2. The left ventricular systolic function is normal, LVEF is visually  estimated at > 55%.    3. Aortic valve replacement (19 mm On-X, implantation date:  11/2007).    4. There is moderate regurgitation (probably paravalvular) of the prosthetic  aortic valve.    5. There are elevated velocities across the mechanical aortic valve.  The  aortic valve doppler indices are likely consistent with patient prosthesis  mismatch - moderate as well as a component of high flow from concomitant  aortic regurgitation..    6.  The left atrium is moderately to severely dilated in size.    7. The right ventricle is normal in size, with mildly reduced systolic  function.   8. There is mild pulmonary hypertension.    9. IVC size and inspiratory change suggest mildly elevated right atrial  pressure. (5-10 mmHg).  __________  2D echo 09/30/2016: - Left ventricle: The cavity size was at the upper limits of    normal. Systolic function was normal. The estimated ejection    fraction was in the range of 60% to 65%. Wall motion was normal;    there were no regional wall motion abnormalities. Left    ventricular diastolic function parameters were normal.  - Aortic valve: A mechanical prosthesis was present. Transvalvular    velocity was increased. There was mild stenosis. There was mild    to moderate regurgitation. Peak velocity (S): 315 cm/s. Mean    gradient (S): 20 mm Hg.  - Mitral valve: There was mild regurgitation.  - Left atrium: The atrium was at the upper limits of normal in    size.  - Right ventricle: Systolic function was normal.  - Pulmonary arteries: Systolic pressure was within the normal    range.     EKG:  EKG is ordered today.  The EKG ordered today demonstrates ***  Recent Labs: No results found for requested labs within last 365 days.  Recent Lipid Panel    Component Value Date/Time   CHOL 218 (H) 10/01/2021 1525   TRIG 84 10/01/2021 1525   HDL 57 10/01/2021 1525   CHOLHDL 3.8 10/01/2021 1525   LDLCALC 146 (H) 10/01/2021 1525    PHYSICAL EXAM:    VS:  There were no vitals taken for this visit.  BMI: There is no height or weight on file to calculate BMI.  Physical Exam  Wt Readings from Last 3 Encounters:  03/15/22 174 lb (78.9 kg)  10/01/21 180 lb (81.6 kg)  09/10/20 183 lb (83 kg)     ASSESSMENT & PLAN:   History of bicuspid aortic valve with severe aortic stenosis status post mechanical aortic valve replacement with subsequent aortic valve insufficiency: Status post  mechanical AVR at [facility] in 2009.  HTN: Blood pressure  HLD: LDL 146 in 09/2021.  Previously intolerant to atorvastatin, simvastatin, and rosuvastatin.  Tobacco use:   {Are you ordering a CV Procedure (e.g. stress test, cath, DCCV, TEE, etc)?   Press F2        :[id number]}     Disposition: F/u with Dr. Kirke Corin or an APP in ***.   Medication Adjustments/Labs and Tests Ordered: Current medicines are reviewed at length with the patient today.  Concerns regarding medicines are outlined above. Medication changes, Labs and Tests ordered today  are summarized above and listed in the Patient Instructions accessible in Encounters.   Signed, Eula Listen, PA-C 10/27/2022 9:34 AM     [facility] [place] [address] [place], [place] [address] [phone]

## 2022-10-31 ENCOUNTER — Ambulatory Visit: Payer: Self-pay | Admitting: Physician Assistant

## 2022-11-02 ENCOUNTER — Ambulatory Visit: Payer: Medicare HMO | Attending: Cardiovascular Disease

## 2022-11-02 DIAGNOSIS — Z952 Presence of prosthetic heart valve: Secondary | ICD-10-CM

## 2022-11-02 DIAGNOSIS — I359 Nonrheumatic aortic valve disorder, unspecified: Secondary | ICD-10-CM | POA: Diagnosis not present

## 2022-11-02 DIAGNOSIS — Z5181 Encounter for therapeutic drug level monitoring: Secondary | ICD-10-CM

## 2022-11-02 DIAGNOSIS — Z7901 Long term (current) use of anticoagulants: Secondary | ICD-10-CM

## 2022-11-02 LAB — POCT INR: INR: 1.7 — AB (ref 2.0–3.0)

## 2022-11-02 NOTE — Patient Instructions (Signed)
-   continue dosage of warfarin dosage of 10 mg every day - Recheck 8 weeks 

## 2022-11-10 ENCOUNTER — Ambulatory Visit: Payer: Medicare HMO | Attending: Physician Assistant | Admitting: Cardiovascular Disease

## 2022-11-10 ENCOUNTER — Encounter: Payer: Self-pay | Admitting: Cardiovascular Disease

## 2022-11-10 VITALS — BP 128/58 | HR 66 | Ht 69.0 in | Wt 171.0 lb

## 2022-11-10 DIAGNOSIS — I10 Essential (primary) hypertension: Secondary | ICD-10-CM | POA: Diagnosis not present

## 2022-11-10 DIAGNOSIS — I359 Nonrheumatic aortic valve disorder, unspecified: Secondary | ICD-10-CM | POA: Diagnosis not present

## 2022-11-10 DIAGNOSIS — Z720 Tobacco use: Secondary | ICD-10-CM

## 2022-11-10 DIAGNOSIS — E782 Mixed hyperlipidemia: Secondary | ICD-10-CM | POA: Diagnosis not present

## 2022-11-10 MED ORDER — EZETIMIBE 10 MG PO TABS: 10.0000 mg | ORAL_TABLET | Freq: Every day | ORAL | 3 refills | Status: DC

## 2022-11-10 MED ORDER — LOSARTAN POTASSIUM 50 MG PO TABS: 50.0000 mg | ORAL_TABLET | Freq: Every day | ORAL | 3 refills | Status: DC

## 2022-11-10 NOTE — Progress Notes (Signed)
Cardiology Office Note   Date:  11/10/2022   ID:  Trevor Santiago, DOB 12-19-1955, MRN [id number]  PCP:  Patient, No Pcp Per  Cardiologist:   Lorine Bears, MD   Chief Complaint  Patient presents with   OTHER    6 month f/u no complaints today. Meds reviewed verbally with pt.       History of Present Illness: Trevor Santiago is a 66 y.o. male who presents for a follow-up visit regarding aortic valve replacement with mechanical aortic valve. He presented in 2009 with symptomatic critical aortic stenosis due to bicuspid aortic valve. He underwent aortic valve replacement with an investigational mechanical aortic valve at [facility] without complications. Cardiac catheterization before surgery showed no significant coronary artery disease with mildly dilated ascending aorta. Maintaining therapeutic INR was extremely difficult and required a very large dose of warfarin.  He has been stable on the current dosing.  He retired in 2018 to help his wife at home who has been sick.    He had a fall in March of this year resulting and right shoulder dislocation.  He also reports having a hematoma in his left thigh.  He was treated conservatively.  It was recommended to decrease his INR goal to 1.5-2.  He has been doing well with no recent chest pain, shortness of breath or palpitations.  Past Medical History:  Diagnosis Date   Aortic insufficiency    a. 09/2016 Echo: mild to mod AI (mech valve prosthesis).   Back pain    Bicuspid aortic valve w/ severe Ao Stenosis    a. 2009 s/p mechanical aortic valve replacement at [facility]; b. 09/2016 Echo: EF 60-65%, no rwma, mild to mod AI, mild MR, nl RV fxn.   Essential hypertension    Hyperlipidemia    Tobacco abuse     Past Surgical History:  Procedure Laterality Date   AORTIC VALVE REPLACEMENT     EXTERNAL EAR SURGERY       Current Outpatient Medications  Medication Sig Dispense Refill   ezetimibe (ZETIA) 10 MG tablet Take 1 tablet (10 mg total)  by mouth daily. 90 tablet 3   metoprolol succinate (TOPROL-XL) 25 MG 24 hr tablet Take 1 tablet by mouth once daily 90 tablet 1   Omega-3 Fatty Acids (FISH OIL PO) Take by mouth daily.     warfarin (COUMADIN) 10 MG tablet Take 1 tablet by mouth daily or as directed by the coumadin clinic. 35 tablet 1   Coenzyme Q10 (CO Q 10 PO) Take by mouth daily. (Patient not taking: Reported on 11/10/2022)     losartan (COZAAR) 50 MG tablet Take 1 tablet (50 mg total) by mouth daily. 90 tablet 3   No current facility-administered medications for this visit.    Allergies:   Atorvastatin, Penicillins, Rosuvastatin, Simvastatin, and Tetracyclines & related    Social History:  The patient  reports that he has been smoking cigarettes. He has been smoking an average of 0.25 packs per day. He has never used smokeless tobacco. He reports current alcohol use. He reports that he does not use drugs.   Family History:  The patient's family history is not on file.    ROS:  Please see the history of present illness.   Otherwise, review of systems are positive for none.   All other systems are reviewed and negative.    PHYSICAL EXAM: VS:  BP (!) 128/58 (BP Location: Left Arm, Patient Position: Sitting, Cuff Size: Normal)  Pulse 66   Ht 5\' 9"  (1.753 m)   Wt 171 lb (77.6 kg)   SpO2 99%   BMI 25.25 kg/m  , BMI Body mass index is 25.25 kg/m. GEN: Well nourished, well developed, in no acute distress  HEENT: normal  Neck: no JVD, carotid bruits, or masses Cardiac: RRR with normal mechanical heart sounds; no  rubs, or gallops,no edema . 1/6 systolic ejection murmur in the aortic area Respiratory:  clear to auscultation bilaterally, normal work of breathing GI: soft, nontender, nondistended, + BS MS: no deformity or atrophy  Skin: warm and dry, no rash Neuro:  Strength and sensation are intact Psych: euthymic mood, full affect   EKG:  EKG is ordered today. The ekg ordered today demonstrates sinus rhythm with  sinus arrhythmia, minimal LVH with repolarization abnormalities.   Recent Labs: No results found for requested labs within last 365 days.    Lipid Panel    Component Value Date/Time   CHOL 218 (H) 10/01/2021 1525   TRIG 84 10/01/2021 1525   HDL 57 10/01/2021 1525   CHOLHDL 3.8 10/01/2021 1525   LDLCALC 146 (H) 10/01/2021 1525      Wt Readings from Last 3 Encounters:  11/10/22 171 lb (77.6 kg)  03/15/22 174 lb (78.9 kg)  10/01/21 180 lb (81.6 kg)           No data to display            ASSESSMENT AND PLAN:  1.  Status post aortic valve replacement with mechanical valve: Currently with no symptoms suggestive of angina or heart failure.  Echocardiogram in 2018 showed normal functioning mechanical aortic valve with normal ejection fraction.  I requested a follow-up echocardiogram to evaluate his mechanical aortic valve and also the ascending aorta which was mildly dilated. INR goal has been reduced to 1.5-2 given that his valve is low risk for thrombosis.  2. Tobacco use: He cut down significantly but did not quit.  I discussed with him the importance of complete smoking cessation.    3.  Essential hypertension: Blood pressure is well-controlled.  I refilled losartan.  4.  Hyperlipidemia: He reports intolerance to atorvastatin due to myalgia.  He was Zetia but did not start the medication.  I talked with him about this and he is going to resume this.   Disposition:   FU with me in 1 year.   Signed,  2019, MD  11/10/2022 5:33 PM    [facility]

## 2022-11-10 NOTE — Patient Instructions (Signed)
Medication Instructions:  START Zetia 10 mg once daily  *If you need a refill on your cardiac medications before your next appointment, please call your pharmacy*   Lab Work: None ordered If you have labs (blood work) drawn today and your tests are completely normal, you will receive your results only by: MyChart Message (if you have MyChart) OR A paper copy in the mail If you have any lab test that is abnormal or we need to change your treatment, we will call you to review the results.   Testing/Procedures: Your physician has requested that you have an echocardiogram. Echocardiography is a painless test that uses sound waves to create images of your heart. It provides your doctor with information about the size and shape of your heart and how well your heart's chambers and valves are working.   You may receive an ultrasound enhancing agent through an IV if needed to better visualize your heart during the echo. This procedure takes approximately one hour.  There are no restrictions for this procedure.  This will take place at [address], [place] [address]    Follow-Up: At [facility], you and your health needs are our priority.  As part of our continuing mission to provide you with exceptional heart care, we have created designated Provider Care Teams.  These Care Teams include your primary Cardiologist (physician) and Advanced Practice Providers (APPs -  Physician Assistants and Nurse Practitioners) who all work together to provide you with the care you need, when you need it.  We recommend signing up for the patient portal called "MyChart".  Sign up information is provided on this After Visit Summary.  MyChart is used to connect with patients for Virtual Visits (Telemedicine).  Patients are able to view lab/test results, encounter notes, upcoming appointments, etc.  Non-urgent messages can be sent to your provider as well.   To learn more  about what you can do with MyChart, go to [email or web address].    Your next appointment:   12 month(s)  The format for your next appointment:   In Person  Provider:   You may see Lorine Bears, MD or one of the following Advanced Practice Providers on your designated Care Team:   Nicolasa Ducking, NP Eula Listen, PA-C Cadence Fransico Michael, PA-C Charlsie Quest, NP     Important Information About Sugar

## 2022-12-13 ENCOUNTER — Other Ambulatory Visit: Payer: Self-pay | Admitting: Cardiovascular Disease

## 2022-12-13 DIAGNOSIS — Z7901 Long term (current) use of anticoagulants: Secondary | ICD-10-CM

## 2022-12-13 NOTE — Telephone Encounter (Signed)
LAST INR 11/02/2022 LAST OV 11/10/2022

## 2022-12-28 ENCOUNTER — Ambulatory Visit: Payer: Medicare HMO | Attending: Cardiovascular Disease

## 2022-12-28 DIAGNOSIS — Z7901 Long term (current) use of anticoagulants: Secondary | ICD-10-CM | POA: Diagnosis not present

## 2022-12-28 DIAGNOSIS — I359 Nonrheumatic aortic valve disorder, unspecified: Secondary | ICD-10-CM | POA: Diagnosis not present

## 2022-12-28 DIAGNOSIS — Z5181 Encounter for therapeutic drug level monitoring: Secondary | ICD-10-CM | POA: Diagnosis not present

## 2022-12-28 DIAGNOSIS — Z952 Presence of prosthetic heart valve: Secondary | ICD-10-CM | POA: Diagnosis not present

## 2022-12-28 LAB — POCT INR: INR: 1.5 — AB (ref 2.0–3.0)

## 2022-12-28 NOTE — Patient Instructions (Signed)
-  continue dosage of warfarin dosage of 10 mg every day - Recheck 8 weeks

## 2023-01-12 ENCOUNTER — Ambulatory Visit: Payer: Medicare HMO | Attending: Cardiovascular Disease

## 2023-01-12 DIAGNOSIS — I359 Nonrheumatic aortic valve disorder, unspecified: Secondary | ICD-10-CM

## 2023-01-12 LAB — ECHOCARDIOGRAM COMPLETE
AR max vel: 1.14 cm2
AV Area VTI: 1.35 cm2
AV Area mean vel: 1.38 cm2
AV Mean grad: 17.7 mmHg
AV Peak grad: 42.5 mmHg
Ao pk vel: 3.26 m/s
Area-P 1/2: 4.6 cm2
Calc EF: 54.5 %
P 1/2 time: 383 msec
S' Lateral: 3.7 cm
Single Plane A2C EF: 51.5 %
Single Plane A4C EF: 57.2 %

## 2023-01-14 ENCOUNTER — Other Ambulatory Visit: Payer: Self-pay | Admitting: Cardiovascular Disease

## 2023-01-14 DIAGNOSIS — Z952 Presence of prosthetic heart valve: Secondary | ICD-10-CM

## 2023-01-14 DIAGNOSIS — I10 Essential (primary) hypertension: Secondary | ICD-10-CM

## 2023-02-22 ENCOUNTER — Ambulatory Visit
Payer: Medicare HMO | Attending: Cardiovascular Disease | Admitting: Pharmacist Clinician (PhC)/ Clinical Pharmacy Specialist

## 2023-02-22 DIAGNOSIS — Z952 Presence of prosthetic heart valve: Secondary | ICD-10-CM

## 2023-02-22 DIAGNOSIS — Z7901 Long term (current) use of anticoagulants: Secondary | ICD-10-CM

## 2023-02-22 DIAGNOSIS — I359 Nonrheumatic aortic valve disorder, unspecified: Secondary | ICD-10-CM | POA: Diagnosis not present

## 2023-02-22 LAB — POCT INR: INR: 2 (ref 2.0–3.0)

## 2023-02-22 NOTE — Patient Instructions (Signed)
-   continue dosage of warfarin dosage of 10 mg every day - Recheck 8 weeks 

## 2023-03-10 ENCOUNTER — Other Ambulatory Visit: Payer: Self-pay | Admitting: Cardiovascular Disease

## 2023-03-10 DIAGNOSIS — Z7901 Long term (current) use of anticoagulants: Secondary | ICD-10-CM

## 2023-03-13 NOTE — Telephone Encounter (Signed)
Warfarin 10mg  refill Aortic valve disorder, History of prosthetic aortic valve  Last INR 02/22/23 Last OV 03/15/22

## 2023-03-13 NOTE — Telephone Encounter (Signed)
Refill Request.  

## 2023-04-19 ENCOUNTER — Ambulatory Visit: Payer: Medicare HMO | Attending: Cardiovascular Disease

## 2023-04-19 DIAGNOSIS — I359 Nonrheumatic aortic valve disorder, unspecified: Secondary | ICD-10-CM

## 2023-04-19 DIAGNOSIS — Z952 Presence of prosthetic heart valve: Secondary | ICD-10-CM

## 2023-04-19 DIAGNOSIS — Z7901 Long term (current) use of anticoagulants: Secondary | ICD-10-CM | POA: Diagnosis not present

## 2023-04-19 LAB — POCT INR: INR: 1.4 — AB (ref 2.0–3.0)

## 2023-04-19 NOTE — Patient Instructions (Signed)
-   TAKE 1.5 TABLETS TODAY ONLY THEN continue dosage of warfarin dosage of 10 mg every day - Recheck 8 weeks

## 2023-06-14 ENCOUNTER — Ambulatory Visit: Payer: Medicare HMO

## 2023-06-21 ENCOUNTER — Ambulatory Visit: Payer: Medicare HMO | Attending: Cardiovascular Disease

## 2023-06-21 DIAGNOSIS — Z952 Presence of prosthetic heart valve: Secondary | ICD-10-CM

## 2023-06-21 DIAGNOSIS — Z7901 Long term (current) use of anticoagulants: Secondary | ICD-10-CM | POA: Diagnosis not present

## 2023-06-21 DIAGNOSIS — I359 Nonrheumatic aortic valve disorder, unspecified: Secondary | ICD-10-CM

## 2023-06-21 LAB — POCT INR: INR: 1.7 — AB (ref 2.0–3.0)

## 2023-06-21 NOTE — Patient Instructions (Signed)
-   continue dosage of warfarin dosage of 10 mg every day - Recheck 8 weeks 

## 2023-06-23 ENCOUNTER — Other Ambulatory Visit: Payer: Self-pay | Admitting: Pharmacist

## 2023-06-23 DIAGNOSIS — Z7901 Long term (current) use of anticoagulants: Secondary | ICD-10-CM

## 2023-06-23 DIAGNOSIS — I359 Nonrheumatic aortic valve disorder, unspecified: Secondary | ICD-10-CM

## 2023-06-23 DIAGNOSIS — Z952 Presence of prosthetic heart valve: Secondary | ICD-10-CM

## 2023-06-23 MED ORDER — WARFARIN SODIUM 10 MG PO TABS: ORAL_TABLET | ORAL | 0 refills | Status: DC

## 2023-07-12 ENCOUNTER — Other Ambulatory Visit: Payer: Self-pay | Admitting: Cardiovascular Disease

## 2023-07-12 DIAGNOSIS — I10 Essential (primary) hypertension: Secondary | ICD-10-CM

## 2023-07-12 DIAGNOSIS — Z952 Presence of prosthetic heart valve: Secondary | ICD-10-CM

## 2023-08-16 ENCOUNTER — Telehealth: Payer: Self-pay

## 2023-08-16 ENCOUNTER — Ambulatory Visit: Payer: Medicare HMO

## 2023-08-16 NOTE — Telephone Encounter (Signed)
Lpmtcb to reschedule missed INR appt

## 2023-08-23 ENCOUNTER — Ambulatory Visit: Payer: Medicare HMO | Attending: Cardiovascular Disease

## 2023-08-23 DIAGNOSIS — I359 Nonrheumatic aortic valve disorder, unspecified: Secondary | ICD-10-CM

## 2023-08-23 DIAGNOSIS — Z7901 Long term (current) use of anticoagulants: Secondary | ICD-10-CM

## 2023-08-23 DIAGNOSIS — Z952 Presence of prosthetic heart valve: Secondary | ICD-10-CM

## 2023-08-23 LAB — POCT INR: INR: 1.5 — AB (ref 2.0–3.0)

## 2023-08-23 NOTE — Patient Instructions (Signed)
continue dosage of warfarin dosage of 10 mg every day - Recheck 8 weeks

## 2023-10-10 ENCOUNTER — Other Ambulatory Visit: Payer: Self-pay | Admitting: Cardiovascular Disease

## 2023-10-10 DIAGNOSIS — I10 Essential (primary) hypertension: Secondary | ICD-10-CM

## 2023-10-10 DIAGNOSIS — Z952 Presence of prosthetic heart valve: Secondary | ICD-10-CM

## 2023-10-10 NOTE — Telephone Encounter (Signed)
Please contact pt for future appointment. Pt due for 12 month f/u in December.

## 2023-10-18 ENCOUNTER — Ambulatory Visit: Payer: Medicare HMO

## 2023-10-25 ENCOUNTER — Ambulatory Visit: Payer: Medicare HMO | Attending: Cardiovascular Disease

## 2023-10-25 DIAGNOSIS — I359 Nonrheumatic aortic valve disorder, unspecified: Secondary | ICD-10-CM

## 2023-10-25 DIAGNOSIS — Z952 Presence of prosthetic heart valve: Secondary | ICD-10-CM

## 2023-10-25 DIAGNOSIS — Z7901 Long term (current) use of anticoagulants: Secondary | ICD-10-CM

## 2023-10-25 LAB — POCT INR: INR: 1.4 — AB (ref 2.0–3.0)

## 2023-10-25 NOTE — Patient Instructions (Signed)
TAKE 17.5 mg today only then continue dosage of warfarin dosage of 10 mg every day - Recheck 8 weeks

## 2023-10-28 ENCOUNTER — Other Ambulatory Visit: Payer: Self-pay | Admitting: Cardiovascular Disease

## 2023-10-28 DIAGNOSIS — Z7901 Long term (current) use of anticoagulants: Secondary | ICD-10-CM

## 2023-10-28 DIAGNOSIS — I359 Nonrheumatic aortic valve disorder, unspecified: Secondary | ICD-10-CM

## 2023-10-28 DIAGNOSIS — Z952 Presence of prosthetic heart valve: Secondary | ICD-10-CM

## 2023-12-20 ENCOUNTER — Ambulatory Visit: Payer: Self-pay

## 2023-12-27 ENCOUNTER — Ambulatory Visit: Payer: PPO | Attending: Cardiovascular Disease

## 2023-12-27 DIAGNOSIS — Z952 Presence of prosthetic heart valve: Secondary | ICD-10-CM

## 2023-12-27 DIAGNOSIS — Z7901 Long term (current) use of anticoagulants: Secondary | ICD-10-CM

## 2023-12-27 DIAGNOSIS — I359 Nonrheumatic aortic valve disorder, unspecified: Secondary | ICD-10-CM

## 2023-12-27 LAB — POCT INR: INR: 1.6 — AB (ref 2.0–3.0)

## 2023-12-27 NOTE — Patient Instructions (Signed)
continue dosage of warfarin dosage of 10 mg every day - Recheck 8 weeks

## 2024-01-12 ENCOUNTER — Other Ambulatory Visit: Payer: Self-pay | Admitting: Cardiovascular Disease

## 2024-01-12 DIAGNOSIS — I10 Essential (primary) hypertension: Secondary | ICD-10-CM

## 2024-01-12 DIAGNOSIS — Z952 Presence of prosthetic heart valve: Secondary | ICD-10-CM

## 2024-02-10 ENCOUNTER — Other Ambulatory Visit: Payer: Self-pay | Admitting: Cardiovascular Disease

## 2024-02-21 ENCOUNTER — Ambulatory Visit: Payer: PPO | Attending: Cardiovascular Disease

## 2024-02-21 DIAGNOSIS — Z7901 Long term (current) use of anticoagulants: Secondary | ICD-10-CM | POA: Diagnosis not present

## 2024-02-21 DIAGNOSIS — Z952 Presence of prosthetic heart valve: Secondary | ICD-10-CM | POA: Diagnosis not present

## 2024-02-21 DIAGNOSIS — I359 Nonrheumatic aortic valve disorder, unspecified: Secondary | ICD-10-CM | POA: Diagnosis not present

## 2024-02-21 LAB — POCT INR: INR: 1.5 — AB (ref 2.0–3.0)

## 2024-02-21 NOTE — Patient Instructions (Signed)
 Description   Continue dosage of warfarin dosage of 10 mg every day - Recheck 8 weeks

## 2024-02-27 ENCOUNTER — Telehealth: Payer: Self-pay | Admitting: Cardiovascular Disease

## 2024-02-27 DIAGNOSIS — I359 Nonrheumatic aortic valve disorder, unspecified: Secondary | ICD-10-CM

## 2024-02-27 DIAGNOSIS — Z7901 Long term (current) use of anticoagulants: Secondary | ICD-10-CM

## 2024-02-27 DIAGNOSIS — Z952 Presence of prosthetic heart valve: Secondary | ICD-10-CM

## 2024-02-27 MED ORDER — WARFARIN SODIUM 10 MG PO TABS: ORAL_TABLET | ORAL | 1 refills | Status: DC

## 2024-02-27 NOTE — Telephone Encounter (Signed)
*  STAT* If patient is at the pharmacy, call can be transferred to refill team.   1. Which medications need to be refilled? (please list name of each medication and dose if known)  warfarin (COUMADIN) 10 MG tablet  2. Which pharmacy/location (including street and city if local pharmacy) is medication to be sent to? [id number] - [place],  - [address] [place] [address]  3. Do they need a 30 day or 90 day supply?  30 day supply  [facility] with [facility] says the patient is completely out of medication. She will let him know he is overdue for an appointment.

## 2024-02-27 NOTE — Telephone Encounter (Signed)
 Refill request for warfarin:  Last INR was 1.5 on 02/21/24 Next INR due 04/17/24 LOV was 11/10/22  Pt is past due for appt with Dr Kirke Corin.  Message sent to schedulers to make appt.  Refill approved since pt is out of med.

## 2024-03-27 ENCOUNTER — Other Ambulatory Visit: Payer: Self-pay | Admitting: Cardiovascular Disease

## 2024-03-29 ENCOUNTER — Other Ambulatory Visit: Payer: Self-pay | Admitting: Cardiovascular Disease

## 2024-04-12 ENCOUNTER — Other Ambulatory Visit: Payer: Self-pay | Admitting: Cardiovascular Disease

## 2024-04-17 ENCOUNTER — Ambulatory Visit: Attending: Cardiovascular Disease

## 2024-04-24 ENCOUNTER — Ambulatory Visit: Attending: Cardiology

## 2024-04-24 DIAGNOSIS — Z7901 Long term (current) use of anticoagulants: Secondary | ICD-10-CM | POA: Diagnosis not present

## 2024-04-24 DIAGNOSIS — I359 Nonrheumatic aortic valve disorder, unspecified: Secondary | ICD-10-CM

## 2024-04-24 DIAGNOSIS — Z952 Presence of prosthetic heart valve: Secondary | ICD-10-CM | POA: Diagnosis not present

## 2024-04-24 LAB — POCT INR: INR: 1.5 — AB (ref 2.0–3.0)

## 2024-04-24 NOTE — Patient Instructions (Signed)
 Continue dosage of warfarin dosage of 10 mg every day - Recheck 8 weeks

## 2024-05-12 ENCOUNTER — Other Ambulatory Visit: Payer: Self-pay | Admitting: Cardiovascular Disease

## 2024-05-12 DIAGNOSIS — Z952 Presence of prosthetic heart valve: Secondary | ICD-10-CM

## 2024-05-12 DIAGNOSIS — I10 Essential (primary) hypertension: Secondary | ICD-10-CM

## 2024-05-13 ENCOUNTER — Other Ambulatory Visit: Payer: Self-pay | Admitting: Cardiovascular Disease

## 2024-05-13 DIAGNOSIS — Z7901 Long term (current) use of anticoagulants: Secondary | ICD-10-CM

## 2024-05-13 DIAGNOSIS — Z952 Presence of prosthetic heart valve: Secondary | ICD-10-CM

## 2024-05-13 DIAGNOSIS — I359 Nonrheumatic aortic valve disorder, unspecified: Secondary | ICD-10-CM

## 2024-05-13 NOTE — Telephone Encounter (Signed)
 Prescription refill request received for warfarin Lov: 11/10/22 Alvenia Aus)  Next INR check: 06/19/24 Warfarin tablet strength: 10mg   Office visit overdue - note placed on upcoming coumadin  clinic appt for pt to schedule office visit while he is there. Refill sent.

## 2024-05-28 ENCOUNTER — Encounter: Payer: Self-pay | Admitting: Cardiovascular Disease

## 2024-05-28 ENCOUNTER — Ambulatory Visit: Attending: Cardiovascular Disease | Admitting: Cardiovascular Disease

## 2024-05-28 VITALS — BP 112/52 | HR 62 | Ht 68.0 in | Wt 172.0 lb

## 2024-05-28 DIAGNOSIS — E785 Hyperlipidemia, unspecified: Secondary | ICD-10-CM

## 2024-05-28 DIAGNOSIS — I10 Essential (primary) hypertension: Secondary | ICD-10-CM | POA: Diagnosis not present

## 2024-05-28 DIAGNOSIS — I359 Nonrheumatic aortic valve disorder, unspecified: Secondary | ICD-10-CM

## 2024-05-28 NOTE — Progress Notes (Signed)
 Cardiology Office Note   Date:  05/28/2024   ID:  Trevor Santiago, DOB 1956/02/26, MRN [id number]  PCP:  Bertrum Charlie CROME, MD  Cardiologist:   Deatrice Cage, MD   Chief Complaint  Patient presents with   Follow-up    OD 12 month f/u no complaints today. Meds reviewed verbally with pt.       History of Present Illness: Trevor Santiago is a 68 y.o. male who presents for a follow-up visit regarding aortic valve replacement with mechanical aortic valve. He presented in 2009 with symptomatic critical aortic stenosis due to bicuspid aortic valve. He underwent aortic valve replacement with an investigational mechanical aortic valve at [facility] without complications. Cardiac catheterization before surgery showed no significant coronary artery disease with mildly dilated ascending aorta. Maintaining therapeutic INR was extremely difficult and required a very large dose of warfarin.  He has been stable on the current dosing.  He retired in 2018 to help his wife at home who has been sick.    He had left thigh hematoma in 2023 which was treated conservatively.  Since then, the INR goal was decreased to 1.5-2.  He has been doing extremely well with no chest pain, shortness of breath or palpitations.    Past Medical History:  Diagnosis Date   Aortic insufficiency    a. 09/2016 Echo: mild to mod AI (mech valve prosthesis).   Back pain    Bicuspid aortic valve w/ severe Ao Stenosis    a. 2009 s/p mechanical aortic valve replacement at [facility]; b. 09/2016 Echo: EF 60-65%, no rwma, mild to mod AI, mild MR, nl RV fxn.   Essential hypertension    Hyperlipidemia    Tobacco abuse     Past Surgical History:  Procedure Laterality Date   AORTIC VALVE REPLACEMENT     EXTERNAL EAR SURGERY       Current Outpatient Medications  Medication Sig Dispense Refill   ezetimibe  (ZETIA ) 10 MG tablet Take 1 tablet (10 mg total) by mouth daily. Please keep appointment in July for further refills. Thank you. 30  tablet 0   losartan  (COZAAR ) 50 MG tablet Take 1 tablet (50 mg total) by mouth daily. 90 tablet 0   metoprolol  succinate (TOPROL -XL) 25 MG 24 hr tablet TAKE 1 TABLET BY MOUTH ONCE DAILY. NEED APPT FOR REFILL 30 tablet 0   warfarin (COUMADIN ) 10 MG tablet TAKE 1 TABLET BY MOUTH ONCE DAILY OR  AS  DIRECTED  BY  THE  COUMADIN   CLINIC 100 tablet 0   Coenzyme Q10 (CO Q 10 PO) Take by mouth daily. (Patient not taking: Reported on 05/28/2024)     Omega-3 Fatty Acids (FISH OIL PO) Take by mouth daily. (Patient not taking: Reported on 05/28/2024)     No current facility-administered medications for this visit.    Allergies:   Atorvastatin , Penicillins, Rosuvastatin , Simvastatin , and Tetracyclines & related    Social History:  The patient  reports that he has been smoking cigarettes. He has never used smokeless tobacco. He reports current alcohol use. He reports that he does not use drugs.   Family History:  The patient's family history is not on file.    ROS:  Please see the history of present illness.   Otherwise, review of systems are positive for none.   All other systems are reviewed and negative.    PHYSICAL EXAM: VS:  BP (!) 112/52 (BP Location: Left Arm, Patient Position: Sitting, Cuff Size: Normal)  Pulse 62   Ht 5' 8 (1.727 m)   Wt 172 lb (78 kg)   SpO2 99%   BMI 26.15 kg/m  , BMI Body mass index is 26.15 kg/m. GEN: Well nourished, well developed, in no acute distress  HEENT: normal  Neck: no JVD, carotid bruits, or masses Cardiac: RRR with normal mechanical heart sounds; no  rubs, or gallops,no edema . 1/6 systolic ejection murmur in the aortic area Respiratory:  clear to auscultation bilaterally, normal work of breathing GI: soft, nontender, nondistended, + BS MS: no deformity or atrophy  Skin: warm and dry, no rash Neuro:  Strength and sensation are intact Psych: euthymic mood, full affect   EKG:  EKG is ordered today. The ekg ordered today demonstrates : Normal sinus  rhythm Minimal voltage criteria for LVH, may be normal variant ( R in aVL ) When compared with ECG of 01-Dec-2007 09:34, Vent. rate has decreased BY  32 BPM QRS duration has increased Nonspecific T wave abnormality no longer evident in Inferior leads T wave inversion no longer evident in Lateral leads    Recent Labs: No results found for requested labs within last 365 days.    Lipid Panel    Component Value Date/Time   CHOL 218 (H) 10/01/2021 1525   TRIG 84 10/01/2021 1525   HDL 57 10/01/2021 1525   CHOLHDL 3.8 10/01/2021 1525   LDLCALC 146 (H) 10/01/2021 1525      Wt Readings from Last 3 Encounters:  05/28/24 172 lb (78 kg)  11/10/22 171 lb (77.6 kg)  03/15/22 174 lb (78.9 kg)           No data to display            ASSESSMENT AND PLAN:  1.  Status post aortic valve replacement with mechanical valve: Currently with no symptoms suggestive of angina or heart failure.  Most recent echocardiogram in February 2024 showed normal LV systolic function with normal functioning mechanical mitral valve with moderate regurgitation.   INR goal has been reduced to 1.5-2 given that his valve is low risk for thrombosis.  2. Tobacco use: He cut down significantly but did not quit.    3.  Essential hypertension: Blood pressure is well-controlled.  I requested routine labs  4.  Hyperlipidemia: He reports intolerance to atorvastatin  due to myalgia.  He is currently on Zetia .  I requested lipid and liver profile.   Disposition:   FU with me in 1 year.   Signed,  Deatrice Cage, MD  05/28/2024 4:52 PM    [facility]

## 2024-05-28 NOTE — Patient Instructions (Signed)
 Medication Instructions:  No  changes *If you need a refill on your cardiac medications before your next appointment, please call your pharmacy*  Lab Work: Your provider would like for you to return in a few days to have the following labs drawn: Lipid, CMET, CBC and PSA.   Please go to [facility] [address] ([facility] [address], [place] [address] You do not need an appointment.  They are open from 8 am- 4:30 pm.  Lunch from 1:00 pm- 2:00 pm You will need to be fasting.   You may also go to one of the following [facility]:  [address] [place], [place] [address] Phone: [phone] Lab hours: Mon-Fri 8 am- 5 pm    Lunch 12 pm- 1 pm  [address] [place],  [place]  [address]  [place]  Phone: [phone] Lab hours: 7 am- 4 pm Lunch 12 pm-1 pm   [address] [place],  [place]  [address]  [place]  Phone: [phone] Lab hours: Mon-Fri 8 am- 5 pm    Lunch 12 pm- 1 pm  If you have labs (blood work) drawn today and your tests are completely normal, you will receive your results only by: MyChart Message (if you have MyChart) OR A paper copy in the mail If you have any lab test that is abnormal or we need to change your treatment, we will call you to review the results.  Testing/Procedures: None ordered  Follow-Up: At [facility], you and your health needs are our priority.  As part of our continuing mission to provide you with exceptional heart care, our providers are all part of one team.  This team includes your primary Cardiologist (physician) and Advanced Practice Providers or APPs (Physician Assistants and Nurse Practitioners) who all work together to provide you with the care you need, when you need it.  Your next appointment:   12 month(s)  Provider:   You may see Deatrice Cage, MD or one of the following Advanced Practice Providers on your designated Care Team:   Lonni Meager, NP Lesley Maffucci, PA-C Bernardino Bring, PA-C Cadence [place],  PA-C Tylene Lunch, NP Barnie Hila, NP    We recommend signing up for the patient portal called MyChart.  Sign up information is provided on this After Visit Summary.  MyChart is used to connect with patients for Virtual Visits (Telemedicine).  Patients are able to view lab/test results, encounter notes, upcoming appointments, etc.  Non-urgent messages can be sent to your provider as well.   To learn more about what you can do with MyChart, go to [email or web address].

## 2024-05-30 ENCOUNTER — Other Ambulatory Visit: Payer: Self-pay | Admitting: Cardiovascular Disease

## 2024-06-17 ENCOUNTER — Other Ambulatory Visit: Payer: Self-pay | Admitting: Cardiovascular Disease

## 2024-06-17 DIAGNOSIS — I10 Essential (primary) hypertension: Secondary | ICD-10-CM

## 2024-06-17 DIAGNOSIS — Z952 Presence of prosthetic heart valve: Secondary | ICD-10-CM

## 2024-06-19 ENCOUNTER — Ambulatory Visit: Attending: Cardiovascular Disease

## 2024-06-19 DIAGNOSIS — I10 Essential (primary) hypertension: Secondary | ICD-10-CM | POA: Diagnosis not present

## 2024-06-19 DIAGNOSIS — Z952 Presence of prosthetic heart valve: Secondary | ICD-10-CM | POA: Diagnosis not present

## 2024-06-19 DIAGNOSIS — Z7901 Long term (current) use of anticoagulants: Secondary | ICD-10-CM

## 2024-06-19 DIAGNOSIS — I359 Nonrheumatic aortic valve disorder, unspecified: Secondary | ICD-10-CM | POA: Diagnosis not present

## 2024-06-19 DIAGNOSIS — E785 Hyperlipidemia, unspecified: Secondary | ICD-10-CM | POA: Diagnosis not present

## 2024-06-19 LAB — POCT INR: INR: 1.4 — AB (ref 2.0–3.0)

## 2024-06-19 NOTE — Patient Instructions (Signed)
 Take 1.5 tablets today only then Continue dosage of warfarin dosage of 10 mg every day - Recheck 8 weeks  [phone]

## 2024-06-20 LAB — COMPREHENSIVE METABOLIC PANEL WITH GFR
ALT: 24 IU/L (ref 0–44)
AST: 24 IU/L (ref 0–40)
Albumin: 4 g/dL (ref 3.9–4.9)
Alkaline Phosphatase: 49 IU/L (ref 44–121)
BUN/Creatinine Ratio: 10 (ref 10–24)
BUN: 12 mg/dL (ref 8–27)
Bilirubin Total: 0.3 mg/dL (ref 0.0–1.2)
CO2: 21 mmol/L (ref 20–29)
Calcium: 9.1 mg/dL (ref 8.6–10.2)
Chloride: 103 mmol/L (ref 96–106)
Creatinine, Ser: 1.15 mg/dL (ref 0.76–1.27)
Globulin, Total: 2.6 g/dL (ref 1.5–4.5)
Glucose: 83 mg/dL (ref 70–99)
Potassium: 4.7 mmol/L (ref 3.5–5.2)
Sodium: 137 mmol/L (ref 134–144)
Total Protein: 6.6 g/dL (ref 6.0–8.5)
eGFR: 70 mL/min/1.73 (ref >59)

## 2024-06-20 LAB — LIPID PANEL
Chol/HDL Ratio: 2.7 ratio (ref 0.0–5.0)
Cholesterol, Total: 178 mg/dL (ref 100–199)
HDL: 65 mg/dL (ref >39)
LDL Chol Calc (NIH): 95 mg/dL (ref 0–99)
Triglycerides: 98 mg/dL (ref 0–149)
VLDL Cholesterol Cal: 18 mg/dL (ref 5–40)

## 2024-06-20 LAB — CBC
Hematocrit: 37.8 % (ref 37.5–51.0)
Hemoglobin: 11.8 g/dL — ABNORMAL LOW (ref 13.0–17.7)
MCH: 29.6 pg (ref 26.6–33.0)
MCHC: 31.2 g/dL — ABNORMAL LOW (ref 31.5–35.7)
MCV: 95 fL (ref 79–97)
Platelets: 247 x10E3/uL (ref 150–450)
RBC: 3.99 x10E6/uL — ABNORMAL LOW (ref 4.14–5.80)
RDW: 14.4 % (ref 11.6–15.4)
WBC: 5.8 x10E3/uL (ref 3.4–10.8)

## 2024-06-20 LAB — PSA: Prostate Specific Ag, Serum: 7.1 ng/mL — ABNORMAL HIGH (ref 0.0–4.0)

## 2024-06-21 ENCOUNTER — Ambulatory Visit: Payer: Self-pay | Admitting: Internal Medicine

## 2024-07-23 ENCOUNTER — Other Ambulatory Visit: Payer: Self-pay | Admitting: Cardiovascular Disease

## 2024-08-14 ENCOUNTER — Ambulatory Visit: Attending: Cardiovascular Disease

## 2024-08-14 DIAGNOSIS — I359 Nonrheumatic aortic valve disorder, unspecified: Secondary | ICD-10-CM

## 2024-08-14 DIAGNOSIS — Z952 Presence of prosthetic heart valve: Secondary | ICD-10-CM | POA: Diagnosis not present

## 2024-08-14 DIAGNOSIS — Z7901 Long term (current) use of anticoagulants: Secondary | ICD-10-CM

## 2024-08-14 LAB — POCT INR: INR: 1.7 — AB (ref 2.0–3.0)

## 2024-08-14 NOTE — Patient Instructions (Signed)
 Continue dosage of warfarin dosage of 10 mg every day - Recheck 8 weeks

## 2024-09-17 ENCOUNTER — Telehealth: Payer: Self-pay | Admitting: Cardiovascular Disease

## 2024-09-17 ENCOUNTER — Other Ambulatory Visit: Payer: Self-pay | Admitting: Cardiovascular Disease

## 2024-09-17 ENCOUNTER — Other Ambulatory Visit: Payer: Self-pay

## 2024-09-17 DIAGNOSIS — I359 Nonrheumatic aortic valve disorder, unspecified: Secondary | ICD-10-CM

## 2024-09-17 MED ORDER — WARFARIN SODIUM 10 MG PO TABS: ORAL_TABLET | ORAL | 0 refills | Status: AC

## 2024-09-17 NOTE — Telephone Encounter (Signed)
*  STAT* If patient is at the pharmacy, call can be transferred to refill team.   1. Which medications need to be refilled? (please list name of each medication and dose if known) warfarin (COUMADIN ) 10 MG tablet    4. Which pharmacy/location (including street and city if local pharmacy) is medication to be sent to? [id number] - [place], [place] - [address] [place] [address]    5. Do they need a 30 day or 90 day supply? 90

## 2024-10-09 ENCOUNTER — Ambulatory Visit: Attending: Cardiovascular Disease

## 2024-10-09 DIAGNOSIS — Z7901 Long term (current) use of anticoagulants: Secondary | ICD-10-CM

## 2024-10-09 DIAGNOSIS — Z952 Presence of prosthetic heart valve: Secondary | ICD-10-CM

## 2024-10-09 DIAGNOSIS — I359 Nonrheumatic aortic valve disorder, unspecified: Secondary | ICD-10-CM

## 2024-10-09 LAB — POCT INR: INR: 1.9 — AB (ref 2.0–3.0)

## 2024-10-09 NOTE — Patient Instructions (Signed)
 Continue dosage of warfarin dosage of 10 mg every day - Recheck 8 weeks

## 2024-12-04 ENCOUNTER — Ambulatory Visit

## 2024-12-04 DIAGNOSIS — Z7901 Long term (current) use of anticoagulants: Secondary | ICD-10-CM

## 2024-12-04 DIAGNOSIS — I359 Nonrheumatic aortic valve disorder, unspecified: Secondary | ICD-10-CM

## 2024-12-04 DIAGNOSIS — Z952 Presence of prosthetic heart valve: Secondary | ICD-10-CM

## 2024-12-04 LAB — POCT INR: INR: 2.2 (ref 2.0–3.0)

## 2024-12-04 NOTE — Patient Instructions (Signed)
 Continue dosage of warfarin dosage of 10 mg every day; Eat greens tonight. - Recheck 8 weeks

## 2025-01-29 ENCOUNTER — Ambulatory Visit

## 2025-02-05 ENCOUNTER — Ambulatory Visit
